# Patient Record
Sex: Female | Born: 1985 | Race: Black or African American | Hispanic: No | Smoking: Current every day smoker
Health system: Southern US, Community
[De-identification: ages and names within clinical notes are randomized; demographics above are authoritative.]

## PROBLEM LIST (undated history)

## (undated) DIAGNOSIS — M549 Dorsalgia, unspecified: Secondary | ICD-10-CM

## (undated) DIAGNOSIS — D573 Sickle-cell trait: Secondary | ICD-10-CM

## (undated) DIAGNOSIS — M25559 Pain in unspecified hip: Secondary | ICD-10-CM

## (undated) HISTORY — PX: OTHER SURGICAL HISTORY: SHX169

## (undated) HISTORY — DX: Sickle-cell trait: D57.3

---

## 2006-02-22 ENCOUNTER — Other Ambulatory Visit: Payer: Self-pay

## 2011-08-10 ENCOUNTER — Emergency Department (HOSPITAL_BASED_OUTPATIENT_CLINIC_OR_DEPARTMENT_OTHER)
Admission: EM | Admit: 2011-08-10 | Discharge: 2011-08-10 | Disposition: A | Discharge status: Home / Self Care | Payer: Medicaid Other | Attending: Emergency Medicine | Admitting: Emergency Medicine

## 2011-08-10 ENCOUNTER — Emergency Department (HOSPITAL_BASED_OUTPATIENT_CLINIC_OR_DEPARTMENT_OTHER): Payer: Medicaid Other

## 2011-08-10 ENCOUNTER — Encounter (HOSPITAL_BASED_OUTPATIENT_CLINIC_OR_DEPARTMENT_OTHER): Payer: Self-pay | Admitting: *Deleted

## 2011-08-10 DIAGNOSIS — O200 Threatened abortion: Secondary | ICD-10-CM | POA: Insufficient documentation

## 2011-08-10 DIAGNOSIS — F17200 Nicotine dependence, unspecified, uncomplicated: Secondary | ICD-10-CM | POA: Insufficient documentation

## 2011-08-10 LAB — CBC WITH DIFFERENTIAL/PLATELET
Basophils Absolute: 0 10*3/uL (ref 0.0–0.1)
Basophils Relative: 0 % (ref 0–1)
Eosinophils Absolute: 0 10*3/uL (ref 0.0–0.7)
Lymphs Abs: 4.7 10*3/uL — ABNORMAL HIGH (ref 0.7–4.0)
MCH: 28.2 pg (ref 26.0–34.0)
Neutrophils Relative %: 54 % (ref 43–77)
Platelets: 332 10*3/uL (ref 150–400)
RBC: 4.08 MIL/uL (ref 3.87–5.11)

## 2011-08-10 LAB — URINALYSIS, ROUTINE W REFLEX MICROSCOPIC
Bilirubin Urine: NEGATIVE
Bilirubin Urine: NEGATIVE
Ketones, ur: NEGATIVE mg/dL
Nitrite: NEGATIVE
Nitrite: NEGATIVE
Protein, ur: NEGATIVE mg/dL
Specific Gravity, Urine: 1.03 (ref 1.005–1.030)
Urobilinogen, UA: 1 mg/dL (ref 0.0–1.0)
pH: 6.5 (ref 5.0–8.0)

## 2011-08-10 LAB — URINE MICROSCOPIC-ADD ON

## 2011-08-10 LAB — ABO/RH: ABO/RH(D): A POS

## 2011-08-10 MED ORDER — SODIUM CHLORIDE 0.9 % IV BOLUS (SEPSIS): 1000.0000 mL | Freq: Once | INTRAVENOUS | Status: DC

## 2011-08-10 NOTE — ED Provider Notes (Signed)
History     CSN: [id number]  Arrival date & time 08/10/11  1558   First MD Initiated Contact with Patient 08/10/11 1752      Chief Complaint  Patient presents with  . Vaginal Bleeding    (Consider location/radiation/quality/duration/timing/severity/associated sxs/prior treatment) HPI  Patient with positive pregnancy test June , lmp May 21.  G2p2.  Patient followed at [facility] ob and treated for uti July 11 and treated with septra.  Now having vaginal bleeding began yesterday spotting like light period began at 5 and seen at [facility] and had [place] and told baby ok.  Patient has had three ultrasounds.  Bleeding stopped then today had one big blood clot about the size of her hand at 1530, no further bleeding.  No pain or pressure but some cramps- was having some pain.    History reviewed. No pertinent past medical history.  History reviewed. No pertinent past surgical history.  No family history on file.  History  Substance Use Topics  . Smoking status: Current Everyday Smoker -- 0.5 packs/day  . Smokeless tobacco: Not on file  . Alcohol Use: No    OB History    Grav Para Term Preterm Abortions TAB SAB Ect Mult Living   1               Review of Systems  All other systems reviewed and are negative.    Allergies  Review of patient's allergies indicates no known allergies.  Home Medications   Current Outpatient Rx  Name Route Sig Dispense Refill  . PRENATAL MULTIVITAMIN CH Oral Take 1 tablet by mouth daily.      BP 126/68  Pulse 100  Temp 98.9 F (37.2 C) (Oral)  Resp 18  Ht 5\' 6"  (1.676 m)  Wt 195 lb (88.451 kg)  BMI 31.47 kg/m2  SpO2 99%  LMP 06/13/2011  Physical Exam  Nursing note and vitals reviewed. Constitutional: She appears well-developed and well-nourished.  HENT:  Head: Normocephalic and atraumatic.  Eyes: Conjunctivae and EOM are normal. Pupils are equal, round, and reactive to light.  Neck: Normal range of motion. Neck supple.    Cardiovascular: Normal rate, regular rhythm, normal heart sounds and intact distal pulses.   Pulmonary/Chest: Effort normal and breath sounds normal.  Abdominal: Soft. Bowel sounds are normal.  Genitourinary: There is bleeding around the vagina.       Cervix with pink material protruding about 1 cm   Musculoskeletal: Normal range of motion.  Neurological: She is alert.  Skin: Skin is warm and dry.  Psychiatric: She has a normal mood and affect. Thought content normal.    ED Course  Procedures (including critical care time)  Labs Reviewed  URINALYSIS, ROUTINE W REFLEX MICROSCOPIC - Abnormal; Notable for the following:    Hgb urine dipstick LARGE (*)     All other components within normal limits  PREGNANCY, URINE - Abnormal; Notable for the following:    Preg Test, Ur POSITIVE (*)     All other components within normal limits  URINE MICROSCOPIC-ADD ON   No results found.   No diagnosis found. Repeat urine cath Results for orders placed during the hospital encounter of 08/10/11  URINALYSIS, ROUTINE W REFLEX MICROSCOPIC      Component Value Range   Color, Urine YELLOW  YELLOW   APPearance CLEAR  CLEAR   Specific Gravity, Urine 1.030  1.005 - 1.030   pH 6.5  5.0 - 8.0   Glucose, UA NEGATIVE  NEGATIVE  mg/dL   Hgb urine dipstick LARGE (*) NEGATIVE   Bilirubin Urine NEGATIVE  NEGATIVE   Ketones, ur NEGATIVE  NEGATIVE mg/dL   Protein, ur NEGATIVE  NEGATIVE mg/dL   Urobilinogen, UA 1.0  0.0 - 1.0 mg/dL   Nitrite NEGATIVE  NEGATIVE   Leukocytes, UA NEGATIVE  NEGATIVE  PREGNANCY, URINE      Component Value Range   Preg Test, Ur POSITIVE (*) NEGATIVE  URINE MICROSCOPIC-ADD ON      Component Value Range   Squamous Epithelial / LPF RARE  RARE   RBC / HPF TOO NUMEROUS TO COUNT  <3 RBC/hpf   Bacteria, UA RARE  RARE  CBC WITH DIFFERENTIAL      Component Value Range   WBC 11.6 (*) 4.0 - 10.5 K/uL   RBC 4.08  3.87 - 5.11 MIL/uL   Hemoglobin 11.5 (*) 12.0 - 15.0 g/dL   HCT 62.1  (*) 30.8 - 46.0 %   MCV 80.6  78.0 - 100.0 fL   MCH 28.2  26.0 - 34.0 pg   MCHC 35.0  30.0 - 36.0 g/dL   RDW 65.7  84.6 - 96.2 %   Platelets 332  150 - 400 K/uL   Neutrophils Relative 54  43 - 77 %   Neutro Abs 6.3  1.7 - 7.7 K/uL   Lymphocytes Relative 40  12 - 46 %   Lymphs Abs 4.7 (*) 0.7 - 4.0 K/uL   Monocytes Relative 6  3 - 12 %   Monocytes Absolute 0.7  0.1 - 1.0 K/uL   Eosinophils Relative 0  0 - 5 %   Eosinophils Absolute 0.0  0.0 - 0.7 K/uL   Basophils Relative 0  0 - 1 %   Basophils Absolute 0.0  0.0 - 0.1 K/uL  URINALYSIS, ROUTINE W REFLEX MICROSCOPIC      Component Value Range   Color, Urine YELLOW  YELLOW   APPearance CLEAR  CLEAR   Specific Gravity, Urine 1.026  1.005 - 1.030   pH 6.0  5.0 - 8.0   Glucose, UA NEGATIVE  NEGATIVE mg/dL   Hgb urine dipstick TRACE (*) NEGATIVE   Bilirubin Urine NEGATIVE  NEGATIVE   Ketones, ur NEGATIVE  NEGATIVE mg/dL   Protein, ur NEGATIVE  NEGATIVE mg/dL   Urobilinogen, UA 1.0  0.0 - 1.0 mg/dL   Nitrite NEGATIVE  NEGATIVE   Leukocytes, UA NEGATIVE  NEGATIVE  URINE MICROSCOPIC-ADD ON      Component Value Range   Squamous Epithelial / LPF RARE  RARE   RBC / HPF 0-2  <3 RBC/hpf   Bacteria, UA RARE  RARE      MDM     Patient with threatened ab.  No cultures done as patient with multiple exams this week and reports previous cultures done. Discussed with patient increasing possibility of miscarriage.  Continues with iup on ultrasound.  Patient advised of precautions.     Hilario Quarry, MD 08/10/11 2035

## 2011-08-10 NOTE — ED Notes (Signed)
Pt. Reports bleeding since yesterday.  Pt. Was seen at [facility] last pm and was told the "sac is still intact".  Pt. Is [redacted] wks pregnant and had an ultra snd at [facility] last pm.... Pt. Reports she stopped bleeding after the ED visit last night and started back with bleeding today and having large clots.   Pt. Reports mild to moderated lower abd. Pain.

## 2011-08-10 NOTE — ED Notes (Signed)
[redacted] weeks pregnant. Vaginal bleeding since yesterday. She is being treated for a UTI. Seen at [facility] 5 days ago for abd pain and U/S was normal.

## 2011-08-10 NOTE — ED Notes (Signed)
Pt. Just returned from ultra snd

## 2012-02-24 ENCOUNTER — Other Ambulatory Visit: Payer: Self-pay

## 2012-04-29 ENCOUNTER — Other Ambulatory Visit (HOSPITAL_COMMUNITY): Payer: Self-pay | Admitting: Obstetrics and Gynecology

## 2012-04-29 DIAGNOSIS — IMO0001 Reserved for inherently not codable concepts without codable children: Secondary | ICD-10-CM

## 2012-04-29 DIAGNOSIS — Z3689 Encounter for other specified antenatal screening: Secondary | ICD-10-CM

## 2012-05-21 ENCOUNTER — Encounter (HOSPITAL_COMMUNITY): Payer: Self-pay | Admitting: Obstetrics and Gynecology

## 2012-05-28 ENCOUNTER — Ambulatory Visit (HOSPITAL_COMMUNITY)
Admission: RE | Admit: 2012-05-28 | Discharge: 2012-05-28 | Disposition: A | Discharge status: Home / Self Care | Payer: Medicaid Other | Source: Ambulatory Visit | Attending: Obstetrics and Gynecology | Admitting: Obstetrics and Gynecology

## 2012-05-28 ENCOUNTER — Encounter (HOSPITAL_COMMUNITY): Payer: Self-pay

## 2012-05-28 VITALS — BP 112/59 | HR 87 | Wt 204.0 lb

## 2012-05-28 DIAGNOSIS — O358XX0 Maternal care for other (suspected) fetal abnormality and damage, not applicable or unspecified: Secondary | ICD-10-CM | POA: Insufficient documentation

## 2012-05-28 DIAGNOSIS — Z363 Encounter for antenatal screening for malformations: Secondary | ICD-10-CM | POA: Insufficient documentation

## 2012-05-28 DIAGNOSIS — IMO0001 Reserved for inherently not codable concepts without codable children: Secondary | ICD-10-CM

## 2012-05-28 DIAGNOSIS — Z1389 Encounter for screening for other disorder: Secondary | ICD-10-CM | POA: Insufficient documentation

## 2012-05-28 DIAGNOSIS — O30009 Twin pregnancy, unspecified number of placenta and unspecified number of amniotic sacs, unspecified trimester: Secondary | ICD-10-CM | POA: Insufficient documentation

## 2012-05-28 DIAGNOSIS — Z3689 Encounter for other specified antenatal screening: Secondary | ICD-10-CM

## 2012-05-28 NOTE — Progress Notes (Signed)
Eldana Isip  was seen today for an ultrasound appointment.  See full report in [facility].  Comments: Ms. 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  She has previously undergone extensive genetic counseling at [facility].  Although she is not interested in prenatal testing, she would like testing be be performed after delivery.  She would like to meet with our Genetic counselors to help arrange this following her delivery and will be evaluated at her next follow up.  An echogenic focus was seen in the left cardiac ventricle of Twin B.  This is felt to represent a calcified papillary muscle, and is not associated with structural or functional cardiac abnormalities.  Although an echogenic cardiac focus may be associated with an increased risk of Down syndrome, this risk is felt to be minimal, especially when it is seen as an isolated finding.   Impression: DC/DA twin gestation with best dates of 18 3/7 weeks A twin peak sign is clearly visualized.  A: female, maternal left, posterior placenta     Normal fetal anatomic survey; however, limited views of the fetal face and some cranial anatomy were obtained due to fetal position     Normal amniotic fluid volume     No markers associated with aneuploidy noted  B: female, maternal right, posterior placenta     Echogenic intracardiac focus.  The remainder of the fetal anatomy was within normal limits.     Normal amniotic fluid volume     No other markers associated with aneuploidy were noted  Recommendations: Recommend follow-up ultrasound examination in 4 weeks to complete anatomic survey/ interval growth. Will meet with Genetics counselor at next follow up.  Alpha Gula, MD

## 2012-06-26 ENCOUNTER — Ambulatory Visit (HOSPITAL_COMMUNITY)
Admission: RE | Admit: 2012-06-26 | Discharge: 2012-06-26 | Disposition: A | Discharge status: Home / Self Care | Payer: Medicaid Other | Source: Ambulatory Visit | Attending: Obstetrics and Gynecology | Admitting: Obstetrics and Gynecology

## 2012-06-26 VITALS — BP 111/62 | HR 93 | Wt 203.0 lb

## 2012-06-26 DIAGNOSIS — O30049 Twin pregnancy, dichorionic/diamniotic, unspecified trimester: Secondary | ICD-10-CM | POA: Insufficient documentation

## 2012-06-26 DIAGNOSIS — O99019 Anemia complicating pregnancy, unspecified trimester: Secondary | ICD-10-CM | POA: Insufficient documentation

## 2012-06-26 DIAGNOSIS — D573 Sickle-cell trait: Secondary | ICD-10-CM | POA: Insufficient documentation

## 2012-06-26 DIAGNOSIS — IMO0001 Reserved for inherently not codable concepts without codable children: Secondary | ICD-10-CM

## 2012-06-26 DIAGNOSIS — O30009 Twin pregnancy, unspecified number of placenta and unspecified number of amniotic sacs, unspecified trimester: Secondary | ICD-10-CM | POA: Insufficient documentation

## 2012-06-26 NOTE — Progress Notes (Signed)
[facility]  Indication: 27 yr old G40P2012 at [redacted]w[redacted]d with dichorionic/diamniotic twin gestation with twin B with an echogenic focus in the left ventricle for follow up ultrasound for fetal growth and complete anatomy. 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  Findings: 1. Dichorionic/diamniotic twin gestation; the dividing membrane is seen. 2. Estimated fetal weight for twin A is in the 52nd%; twin B is in the 56th%. The fetuses are concordant. 3. Both placentas are posterior without evidence of previa. 4. Normal amniotic fluid volume for both fetuses but at the top end of normal. 5. Normal transabdominal cervical length. 6. The views of the cavum and palate are limited in twin A; twin B the views of the right ventricular outflow tract and palate are limited. 7. Again seen is an echogenic focus in the left ventricle. 8. The placental cord insertions are not well visualized for either fetus.  Recommendations: 1. Twin gestation: - previously counseled - appropriate fetal growth - recommend fetal growth every 4 weeks - recommend preterm labor precautions - recommend delivery at [redacted] weeks gestation 2. Echogenic focus in twin B: - previously counseled - may have had quad screen although we do not have results 3. Previous son with Norrie disease: - patient met with genetic counselor; see separate report - inform Pediatrics at delivery 4. Limited anatomy: - will reattempt to complete on follow up ultrasound 5. Follow up in 4 weeks  Eulis Foster, MD

## 2012-06-26 NOTE — Progress Notes (Addendum)
Genetic Counseling  High-Risk Gestation Note  Appointment Date:  06/26/2012 Referred By: Ferman Hamming, MD Date of Birth:  27-Jul-1985 Partner:  Lawerance Bach   Pregnancy History: [id number] Estimated Date of Delivery: 10/26/12 Estimated Gestational Age: [redacted]w[redacted]d Attending: Eulis Foster, MD  Wendy Weaver and her partner, Mr. Lawerance Bach, were seen for genetic counseling because of a family history of Norrie disease.  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  In addition, it is suspected that Ms. Schaff' nephew, and a maternal uncle also have ND.  Considering this history, Ms. 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  By report, this couple's son was suspected to have vision concerns at age 24 months.  They were seen by multiple specialists before being referred to [facility], where they saw a pediatric ophthalmologist.  ND was suspected based on the ophthalmologic evaluation and this child was then referred to the department of medical genetics at [facility].  By report, their son had NDP molecular genetic testing and a gene alteration was detected.  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 Medical records were not available for review today.  Ms. Meneely reported that she has these records and will bring them to her next appointment.  We discussed that the accuracy of the counseling and risk assessment provided today is based on the reported information.  They were counseled that ND is one of a group of conditions called NDP-related retinopathies.  ND is the most severe phenotype, and is characterized by a combination of features.  Typically, infants with ND have greyish-yellow fibrovascular masses, called pseudogliomas, that develop secondary to retinal vascular dysgenesis and detachment.  From infancy throughout  childhood the ocular findings progress and the globes eventually appear small and shrunken in the orbits.  Congenital blindness is almost always present.  In addition, approximately 30-50% of affected males have intellectual disability, behavioral abnormalities, or psychotic-like features.   Intra and inter-familial variability in the appearance and expression of the cognitive and behavioral phenotype is common.  The majority of males with ND will develop progressive sensorineural hearing loss.  General health is normal; life span may be shortened by general risks associated with intellectual disability, blindness, and/or hearing loss.    We discussed that the causative gene, NDP, for ND is located on the X chromosome and thus, the condition follows X-linked recessive inheritance. We reviewed genes and chromosomes. We discussed that typically, humans have 23 pairs of chromosomes, 46 chromosomes total, with half inherited from each parent. The first 22 pairs are referred to as the autosomes. The last pair are referred to as the sex chromosomes. Males typically have one X and one Y chromosome, and females typically have two X chromosomes. We reviewed that in X-linked recessive inheritance, carrier females are typically unaffected and can have affected female children. A female carrier refers to a woman with one working copy and one nonworking copy of NDP. Carriers typically have no or mild symptoms of the condition. Each time a carrier female is pregnant, the pregnancy has a new 1 in 4 (25%) chance for each of the following possibilities: (1) a female who is not a carrier, (2) a carrier female like the mother, (3) an unaffected female (not a carrier), or (4) a female with ND. In X-linked recessive inheritance, when an affected female  has children, none of his sons would be affected and all of his daughters would be obligate carriers.   Given that Ms. Bradway and her son have an identified NDP alteration, we discussed  that prenatal diagnosis via amniocentesis is available.  We reviewed the risks, benefits, and limitations of amniocentesis, including the approximate 1 in 300 risk for spontaneous pregnancy loss.  We also discussed that a normal fetal ultrasound does not reduce the likelihood of ND, but can provide information regarding gender.  Ms. Shad is has had numerous ultrasound, which have documented a twin pregnancy; one fetus is female and the other appears to be female.  Thus the female fetus has a 50% chance to have inherited the NDP gene change, and would be expected to be affected with ND.  The female fetus has a 50% chance to have inherited the NDP gene change, and would be expected to be a carrier of ND.  After thoughtful consideration of these options, this couple declined amniocentesis.  They expressed interest in postnatal genetic testing.  We discussed that they would coordinate a postnatal genetics evaluation and/or genetic testing through their pediatrician.  They were again encouraged to provide medical records to verify the reported family and medical history.  Regarding other family members, Ms. Bashor reported that none of her relatives have followed-up with molecular genetic testing of NDP.  Her nephew has clinical features strongly suggestive of ND including blindness, developmental delay, and significant behavioral disturbances.  Ms. Kitch reported that her maternal uncle is also blind, but appears to have normal intellect.  She is not sure whether or not he has hearing loss.  They were counseled that genetic testing can be performed for at-risk relatives for the particular identified familial mutation.  The remainder of both family histories were reviewed and found to be contributory for Ms. Manninen having a paternal first cousin who has mental retardation of unknown etiology.  We discussed that there are many different causes of mental retardation including: environmental, multifactorial, and  genetic etiologies.  A specific diagnosis for mental retardation can be determined in approximately 50% of cases.  In the remaining 50% of cases, a diagnosis may not ever be determined.  We briefly discussed Fragile X syndrome and the availability of FMR1 gene testing.  Ms. Bennison declined.  Without more specific information, potential genetic risks cannot be determined.  The patient was advised to call if she is able to find out more information regarding a specific diagnosis.  Further genetic counseling is warranted if more information is obtained.  Ms. Hobday denied exposure to environmental toxins or chemical agents. She denied the use of alcohol, tobacco or street drugs. She denied significant viral illnesses during the course of her pregnancy.   I counseled Ms. Hill and her partner regarding the above risks and available options.  The approximate face-to-face time with the genetic counselor was 48 minutes.  Donald Prose, MS Certified Genetic Counselor  Addendum: 07/31/12 Ms. Hope returned to clinic for her follow-up ultrasound today.  She brought her son's medical records with her today.  These records confirm the reported history.  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  Ms. Drummer again declined prenatal diagnostic testing today.  As she is pregnant with twins, a boy and a girl, we again reviewed the risks of recurrence.  Ms. Acero is interested in postnatal diagnostic testing.  Contact information for Dr. Charise Killian was provided today.  The patient will contact Dr. Marylen Ponto office at delivery to coordinate a postnatal genetics appointment.   Donald Prose, MS CGC

## 2012-06-28 ENCOUNTER — Encounter (HOSPITAL_COMMUNITY): Payer: Self-pay

## 2012-07-31 ENCOUNTER — Other Ambulatory Visit (HOSPITAL_COMMUNITY): Payer: Self-pay | Admitting: Obstetrics and Gynecology

## 2012-07-31 ENCOUNTER — Ambulatory Visit (HOSPITAL_COMMUNITY)
Admission: RE | Admit: 2012-07-31 | Discharge: 2012-07-31 | Disposition: A | Discharge status: Home / Self Care | Payer: Medicaid Other | Source: Ambulatory Visit | Attending: Obstetrics and Gynecology | Admitting: Obstetrics and Gynecology

## 2012-07-31 VITALS — BP 116/71 | HR 98 | Wt 206.0 lb

## 2012-07-31 DIAGNOSIS — IMO0001 Reserved for inherently not codable concepts without codable children: Secondary | ICD-10-CM

## 2012-07-31 DIAGNOSIS — D573 Sickle-cell trait: Secondary | ICD-10-CM | POA: Insufficient documentation

## 2012-07-31 DIAGNOSIS — O30049 Twin pregnancy, dichorionic/diamniotic, unspecified trimester: Secondary | ICD-10-CM | POA: Insufficient documentation

## 2012-07-31 DIAGNOSIS — O30009 Twin pregnancy, unspecified number of placenta and unspecified number of amniotic sacs, unspecified trimester: Secondary | ICD-10-CM | POA: Insufficient documentation

## 2012-07-31 DIAGNOSIS — O99019 Anemia complicating pregnancy, unspecified trimester: Secondary | ICD-10-CM | POA: Insufficient documentation

## 2012-07-31 NOTE — Progress Notes (Signed)
[facility]  Indication: 27 yr old G19P2012 at [redacted]w[redacted]d with dichorionic/diamniotic twin gestation with twin B with an echogenic focus in the left ventricle for follow up ultrasound for fetal growth and complete anatomy. 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  Findings: 1. Dichorionic/diamniotic twin gestation; the dividing membrane is seen. 2. Estimated fetal weight for twin A is in the 45th%; twin B is in the 70th%. The fetuses are 15% discordant. 3. Both placentas are posterior without evidence of previa. 4. Normal amniotic fluid volume for twin A; slightly increased for twin B. 5. Normal transvaginal cervical length. 6. The view of the cavum is limited in twin A; twin B the views of the right ventricular outflow tract and palate are limited. 7. Normal placental cord insertions. 8. The remainder of the limited anatomy surveys for both fetuses are normal.  Recommendations: 1. Twin gestation: - previously counseled - appropriate fetal growth - recommend fetal growth every 4 weeks - recommend preterm labor precautions - recommend delivery at [redacted] weeks gestation 2. Echogenic focus in twin B: - previously counseled 3. Previous son with Norrie disease: - patient met with genetic counselor; see separate report - inform Pediatrics at delivery 4. Limited anatomy: - will reattempt to complete on follow up ultrasound 5. Follow up in 4 weeks 6. Slightly increased amniotic fluid volume: - recommend check glucola if not already performed  Eulis Foster, MD

## 2012-08-28 ENCOUNTER — Ambulatory Visit (HOSPITAL_COMMUNITY)
Admission: RE | Admit: 2012-08-28 | Discharge: 2012-08-28 | Disposition: A | Discharge status: Home / Self Care | Payer: Medicaid Other | Source: Ambulatory Visit | Attending: Obstetrics and Gynecology | Admitting: Obstetrics and Gynecology

## 2012-08-28 VITALS — BP 116/65 | HR 102 | Wt 209.0 lb

## 2012-08-28 DIAGNOSIS — IMO0001 Reserved for inherently not codable concepts without codable children: Secondary | ICD-10-CM

## 2012-08-28 DIAGNOSIS — D573 Sickle-cell trait: Secondary | ICD-10-CM | POA: Insufficient documentation

## 2012-08-28 DIAGNOSIS — O30009 Twin pregnancy, unspecified number of placenta and unspecified number of amniotic sacs, unspecified trimester: Secondary | ICD-10-CM | POA: Insufficient documentation

## 2012-08-28 DIAGNOSIS — O99019 Anemia complicating pregnancy, unspecified trimester: Secondary | ICD-10-CM | POA: Insufficient documentation

## 2012-08-28 NOTE — Progress Notes (Signed)
Wendy Weaver  was seen today for an ultrasound appointment.  See full report in [facility].  Impression: DC/DA twin gestation with best dates of 31 4/7 weeks Interval growth is concordant  A: female, cephalic, posterior placenta     Normal interval anatomy     Fetal growth is appropriate (35th %tile)     Normal amniotic fluid volume     B: female, maternal right, cephalic, posterior/ fundal placenta     Normal interval anatomy     Fetal growth is appropriate (42nd %tile)     Normal amniotic fluid volume     No other markers associated with aneuploidy were noted  Recommendations: Recommend follow-up ultrasound examination in 4 weeks for interval growth  Alpha Gula, MD

## 2012-09-25 ENCOUNTER — Encounter (HOSPITAL_COMMUNITY): Payer: Self-pay

## 2012-09-25 ENCOUNTER — Ambulatory Visit (HOSPITAL_COMMUNITY)
Admission: RE | Admit: 2012-09-25 | Discharge: 2012-09-25 | Disposition: A | Discharge status: Home / Self Care | Payer: Medicaid Other | Source: Ambulatory Visit | Attending: Obstetrics and Gynecology | Admitting: Obstetrics and Gynecology

## 2012-09-25 ENCOUNTER — Other Ambulatory Visit (HOSPITAL_COMMUNITY): Payer: Self-pay | Admitting: Maternal and Fetal Medicine

## 2012-09-25 DIAGNOSIS — D573 Sickle-cell trait: Secondary | ICD-10-CM | POA: Insufficient documentation

## 2012-09-25 DIAGNOSIS — O30009 Twin pregnancy, unspecified number of placenta and unspecified number of amniotic sacs, unspecified trimester: Secondary | ICD-10-CM | POA: Insufficient documentation

## 2012-09-25 DIAGNOSIS — IMO0001 Reserved for inherently not codable concepts without codable children: Secondary | ICD-10-CM

## 2012-09-25 DIAGNOSIS — O99019 Anemia complicating pregnancy, unspecified trimester: Secondary | ICD-10-CM | POA: Insufficient documentation

## 2012-09-25 NOTE — Progress Notes (Signed)
Wendy Weaver  was seen today for an ultrasound appointment.  See full report in [facility].  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  No stigmata associated with skeletal dysplasia noted.     Normal amniotic fluid volume     B: female, maternal right, cephalic, posterior/ fundal placenta     Normal interval anatomy     Fetal growth is appropriate (21st %tile)     Long bones measuring < 5th %tile, but normal morphology - likely constitutional.  No other findings suggestive of skeletal dysplasia.     Normal amniotic fluid volume     No other markers associated with aneuploidy were noted  Recommendations: Follow-up ultrasounds as clinically indicated.   Alpha Gula, MD

## 2012-11-06 ENCOUNTER — Telehealth (HOSPITAL_COMMUNITY): Payer: Self-pay

## 2012-11-06 NOTE — Telephone Encounter (Signed)
Wendy Weaver called today to inquire about a genetics appt. For her son.  She reported that her twins were born 1 month ago and that both are doing well.  Per our discussion prenatally, Wendy Weaver would like to follow up with pediatric genetics to have her son tested for Norrie disease.  She has called the number I provided her with for Dr. Marylen Ponto office and stated that she hasn't received a return call with an appt.  I called [name], Dr. Marylen Ponto assistant, who stated that they are in the process of scheduling and will notify her asap.  I asked [name] to please notify Wendy Weaver with an appt time, when available.  All questions answered.  Patient encouraged to call back if she has any additional questions or concerns. Donald Prose, MS Certified Genetic Counselor

## 2013-04-02 ENCOUNTER — Encounter (HOSPITAL_COMMUNITY): Payer: Self-pay | Admitting: *Deleted

## 2013-11-24 ENCOUNTER — Encounter (HOSPITAL_COMMUNITY): Payer: Self-pay | Admitting: *Deleted

## 2014-07-17 IMAGING — US US OB FOLLOW-UP
1 series · 15 of 28 positions shown · non-contrast
Comparison: none

[Series 1: us ob follow-up · 0.23mm/px · 15 of 41 slices shown]
[im 1/41]
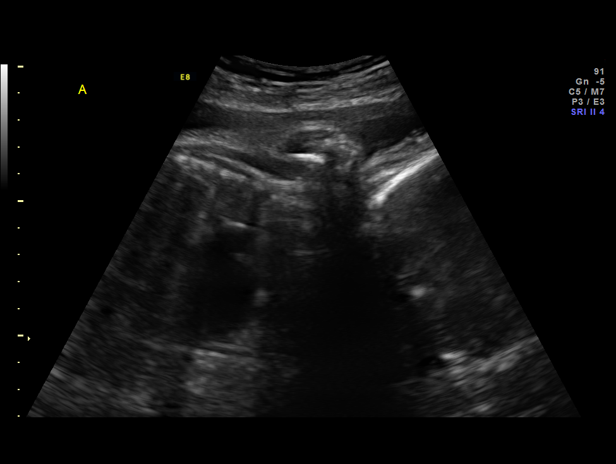
[im 3/41]
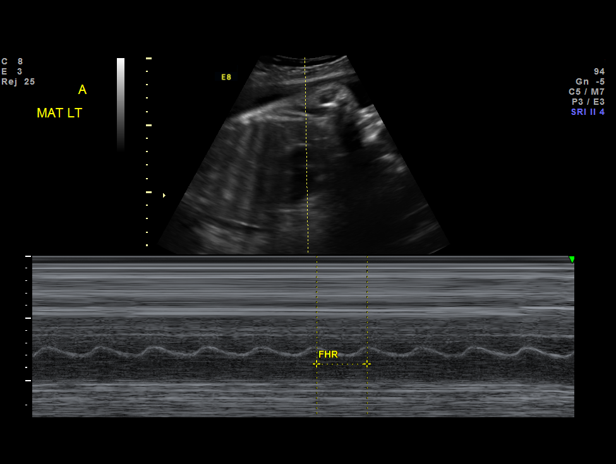
[im 6/41]
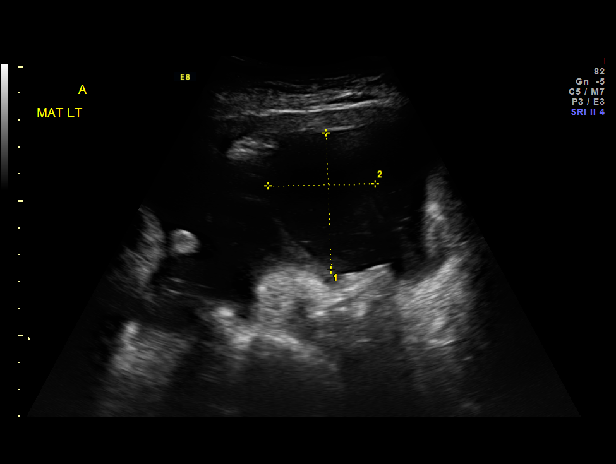
[im 9/41]
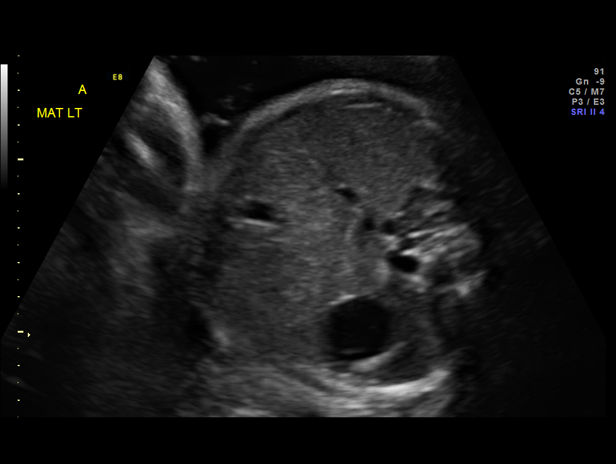
[im 12/41]
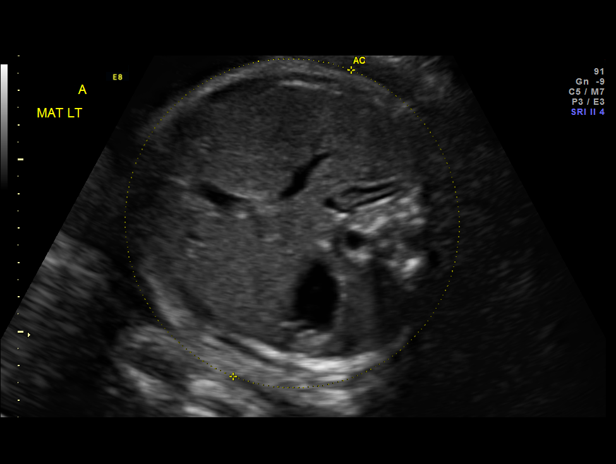
[im 15/41]
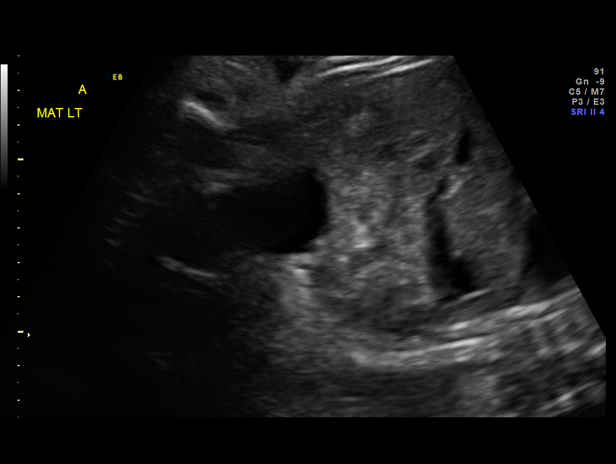
[im 18/41]
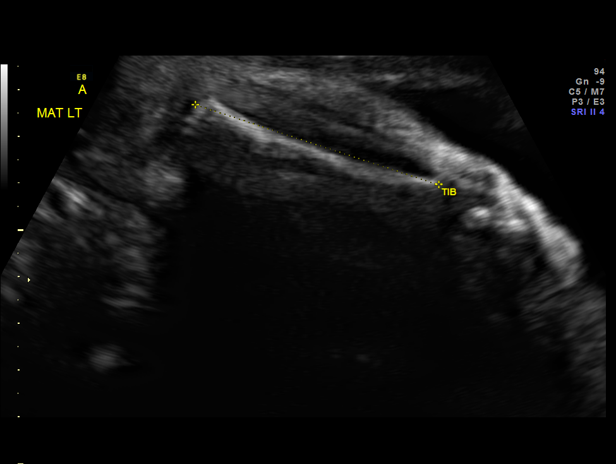
[im 21/41]
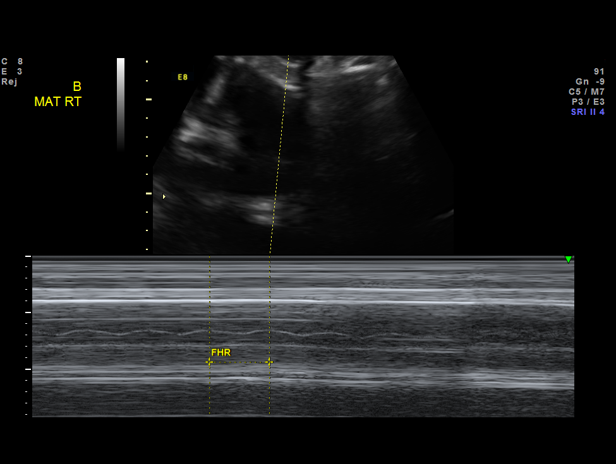
[im 23/41]
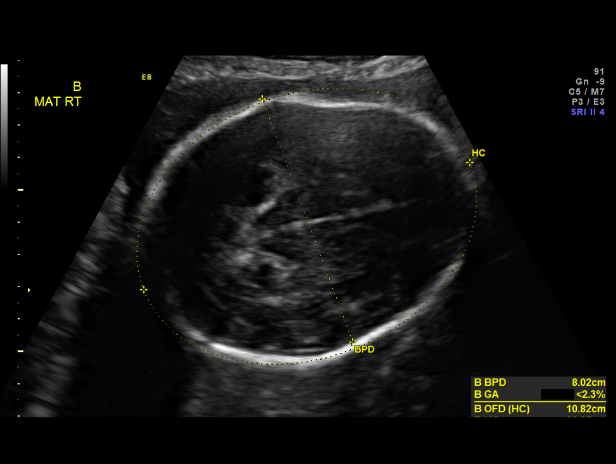
[im 26/41]
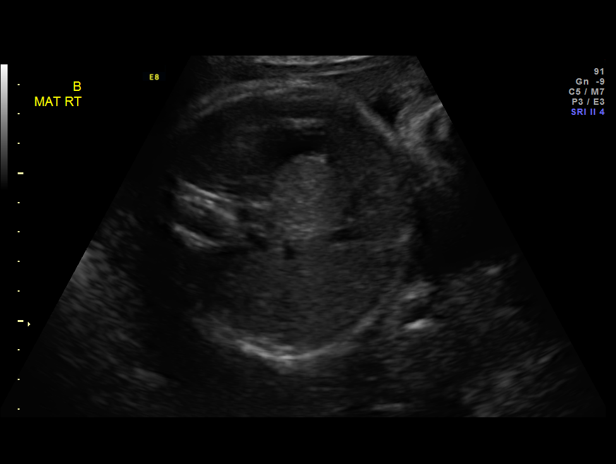
[im 29/41]
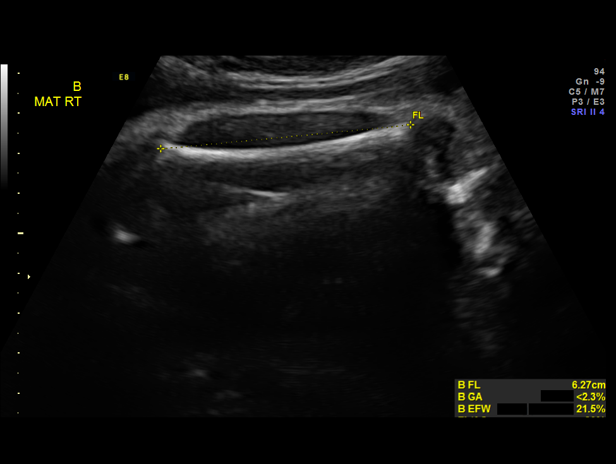
[im 32/41]
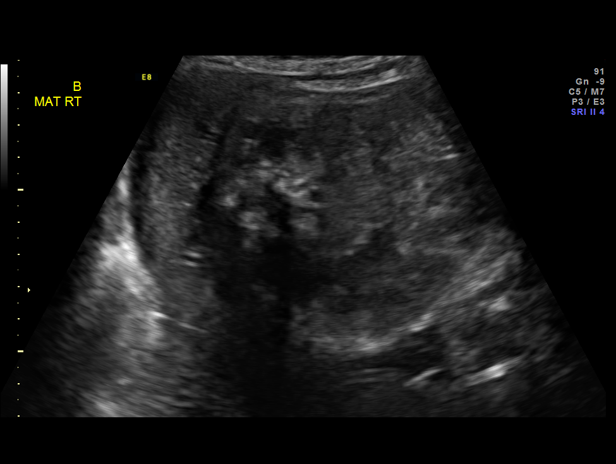
[im 35/41]
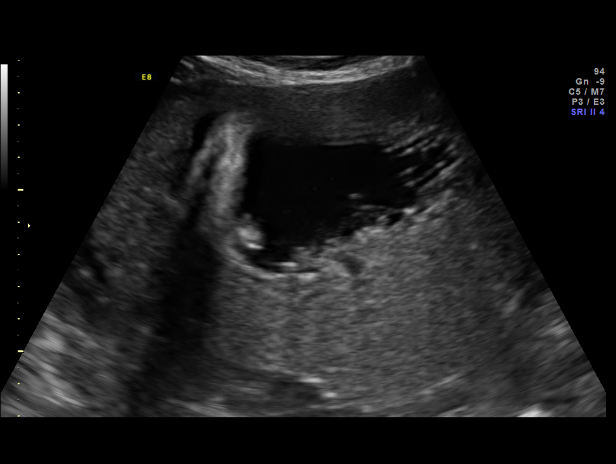
[im 38/41]
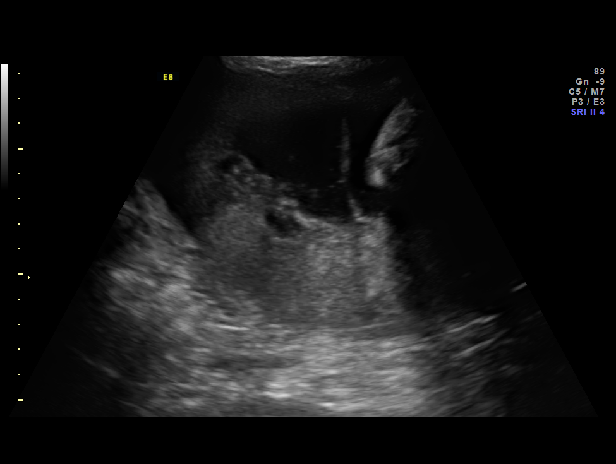
[im 41/41]
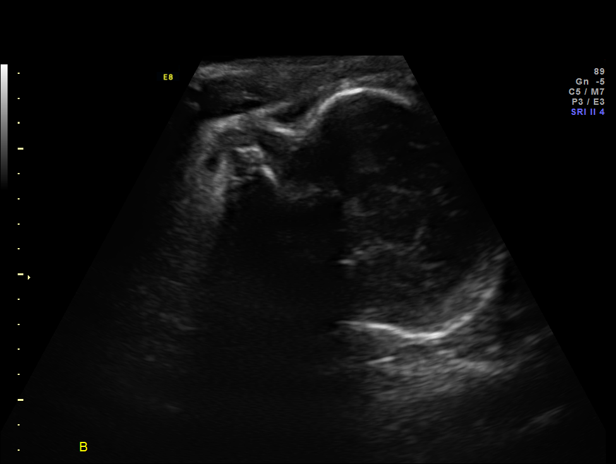

[15 of 28 positions shown; findings below may reference images not displayed]

OBSTETRICS REPORT
                      (Signed Final 09/25/2012 [DATE])

Service(s) Provided

 US OB FOLLOW UP                                       76816.1
 US OB FOLLOW UP ADDL GEST                             76816.2
Indications

 Sickle cell trait
 Twin gestation, Di-Di
Fetal Evaluation (Fetus A)

 Num Of Fetuses:    2
 Fetal Heart Rate:  169                          bpm
 Cardiac Activity:  Observed
 Fetal Lie:         Left Fetus
 Presentation:      Cephalic
 Placenta:          Posterior Fundal, above
                    cervical os
 P. Cord            Previously Visualized
 Insertion:

 Membrane Desc:     Dividing
                    Membrane seen
                    - Dichorionic.

 Amniotic Fluid
 AFI FV:      Subjectively within normal limits
                                             Larg Pckt:     5.1  cm
Biometry (Fetus A)

 BPD:       86  mm     G. Age:  34w 5d                CI:         81.9   70 - 86
 OFD:      105  mm                                    FL/HC:      20.0   20.1 -

 HC:     306.4  mm     G. Age:  34w 1d      < 3  %    HC/AC:      1.02   0.93 -

 AC:     301.1  mm     G. Age:  34w 1d       19  %    FL/BPD:     71.4   71 - 87
 FL:      61.4  mm     G. Age:  31w 6d      < 3  %    FL/AC:      20.4   20 - 24
 HUM:     54.3  mm     G. Age:  31w 4d      < 5  %
 TIB:     54.9  mm     G. Age:  32w 2d      < 5  %
 FIB:     53.9  mm     G. Age:  33w 0d       42  %

 Est. FW:    3350  gm    4 lb 14 oz      19  %     FW Discordancy         1  %
Gestational Age (Fetus A)
 LMP:           35w 4d        Date:  01/20/12                 EDD:   10/26/12
 U/S Today:     33w 5d                                        EDD:   11/08/12
 Best:          35w 4d     Det. By:  LMP  (01/20/12)          EDD:   10/26/12
Anatomy (Fetus A)

 Cranium:          Previously seen        Aortic Arch:      Previously seen
 Fetal Cavum:      Previously seen        Ductal Arch:      Previously seen
 Ventricles:       Previously seen        Diaphragm:        Previously seen
 Choroid Plexus:   Previously seen        Stomach:          Appears normal, left
                                                            sided
 Cerebellum:       Previously seen        Abdomen:          Previously seen
 Posterior Fossa:  Previously seen        Abdominal Wall:   Previously seen
 Nuchal Fold:      Previously seen        Cord Vessels:     Previously seen
 Face:             Orbits and profile     Kidneys:          Appear normal
                   previously seen
 Lips:             Previously seen        Bladder:          Appears normal
 Palate:           Previously seen        Spine:            Previously seen
 Heart:            Previously seen        Lower             Previously seen
                                          Extremities:
 RVOT:             Previously seen        Upper             Previously seen
                                          Extremities:
 LVOT:             Previously seen

 Other:  Female gender. Heels and 5th digit previously seen.

Fetal Evaluation (Fetus B)

 Num Of Fetuses:    2
 Fetal Heart Rate:  142                          bpm
 Cardiac Activity:  Observed
 Fetal Lie:         Right Fetus
 Presentation:      Cephalic
 Placenta:          Posterior Fundal, above
                    cervical os
 P. Cord            Previously Visualized
 Insertion:

 Membrane Desc:     Dividing
                    Membrane seen
                    - Dichorionic.

 Amniotic Fluid
 AFI FV:      Subjectively within normal limits
                                             Larg Pckt:     4.4  cm
Biometry (Fetus B)

 BPD:     80.6  mm     G. Age:  32w 2d                CI:         75.9   70 - 86
 OFD:    106.2  mm                                    FL/HC:      21.0   20.1 -

 HC:       298  mm     G. Age:  33w 0d      < 3  %    HC/AC:      0.97   0.93 -

 AC:     307.2  mm     G. Age:  34w 5d       33  %    FL/BPD:     77.7   71 - 87
 FL:      62.6  mm     G. Age:  32w 3d      < 3  %    FL/AC:      20.4   20 - 24
 HUM:       56  mm     G. Age:  32w 4d        9  %

 Est. FW:    8835  gm    4 lb 15 oz      21  %     FW Discordancy      0 \ 1 %
Gestational Age (Fetus B)

 LMP:           35w 4d        Date:  01/20/12                 EDD:   10/26/12
 U/S Today:     33w 1d                                        EDD:   11/12/12
 Best:          35w 4d     Det. By:  LMP  (01/20/12)          EDD:   10/26/12
Anatomy (Fetus B)

 Cranium:          Previously seen        Aortic Arch:      Previously seen
 Fetal Cavum:      Previously seen        Ductal Arch:      Previously seen
 Ventricles:       Previously seen        Diaphragm:        Previously seen
 Choroid Plexus:   Previously seen        Stomach:          Appears normal, left
                                                            sided
 Cerebellum:       Previously seen        Abdomen:          Previously seen
 Posterior Fossa:  Previously seen        Abdominal Wall:   Previously seen
 Nuchal Fold:      Previously seen        Cord Vessels:     Previously seen
 Face:             previously seen        Kidneys:          Appear normal
 Lips:             Previously seen        Bladder:          Appears normal
 Heart:            Previously seen        Spine:            Previously seen
 RVOT:             Not well visualized    Lower             Previously seen
                                          Extremities:
 LVOT:             Previously seen        Upper             Previously seen
                                          Extremities:

 Other:  Male gender. Heels and 5th digit previously seen.
Cervix Uterus Adnexa

 Cervix:       Not visualized (advanced GA >36wks)
Impression

 DC/DA twin gestation with best dates of 35 [DATE] weeks
 Interval growth is concordant

 A: female, cephalic, posterior placenta
     Normal interval anatomy
     Fetal growth is appropriate (19th %tile)
     Long bones measuring < 5th %tile- likely constitutional.
 No stigmata associated with skeletal dysplasia noted.
     Normal amniotic fluid volume

 B: male, maternal right, cephalic, posterior/ fundal placenta
     Normal interval anatomy
     Fetal growth is appropriate (21st %tile)
     Long bones measuring < 5th %tile, but normal morphology
 - likely constitutional.  No other findings suggestive of skeletal
 dysplasia.
     Normal amniotic fluid volume
     No other markers associated with aneuploidy were noted
Recommendations

 Follow-up ultrasounds as clinically indicated.

 questions or concerns.

## 2014-10-31 IMAGING — US US OB FOLLOW-UP
1 series · 15 of 28 positions shown · non-contrast
Comparison: none

[Series 1: us ob follow-up · 0.28mm/px · 15 of 58 slices shown]
[im 1/58]
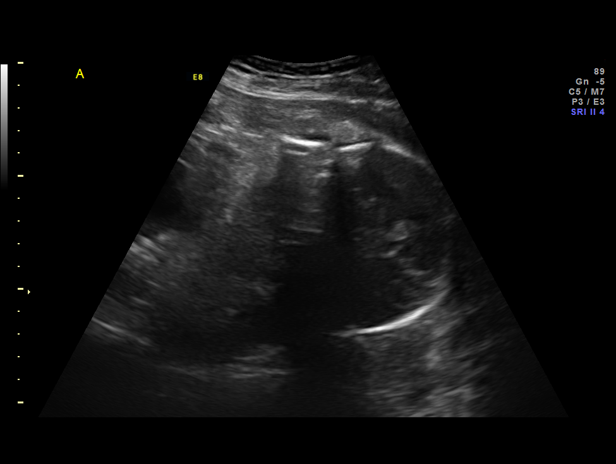
[im 5/58]
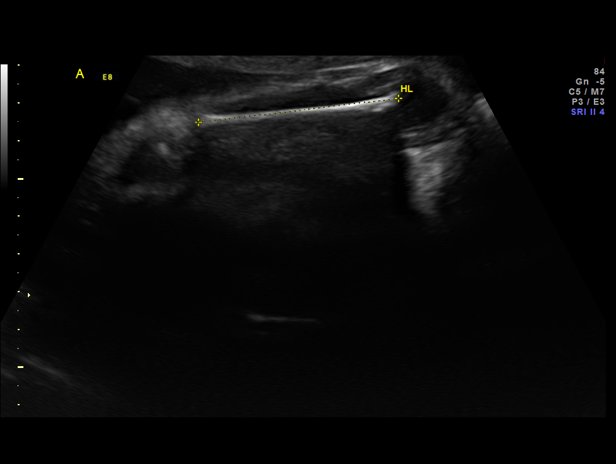
[im 9/58]
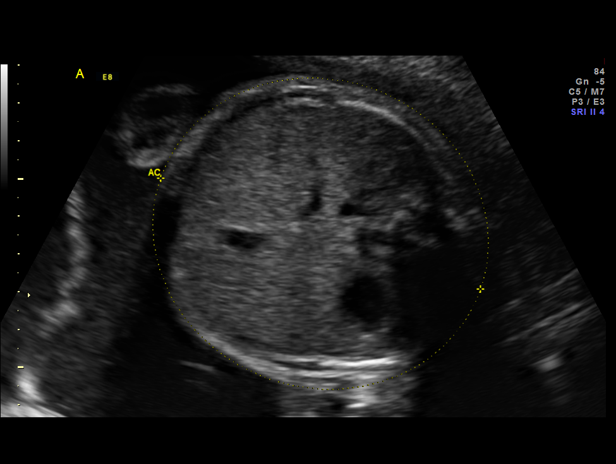
[im 13/58]
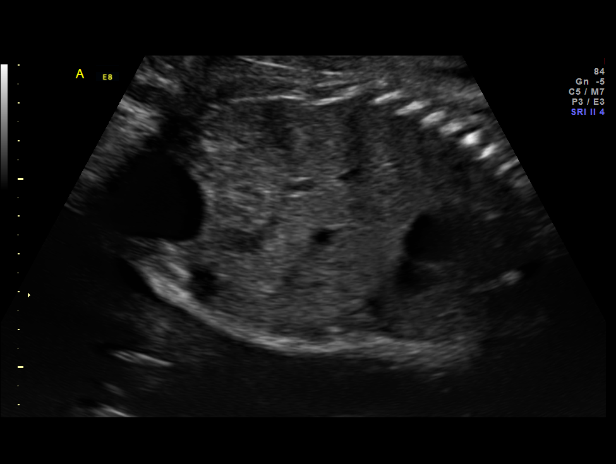
[im 17/58]
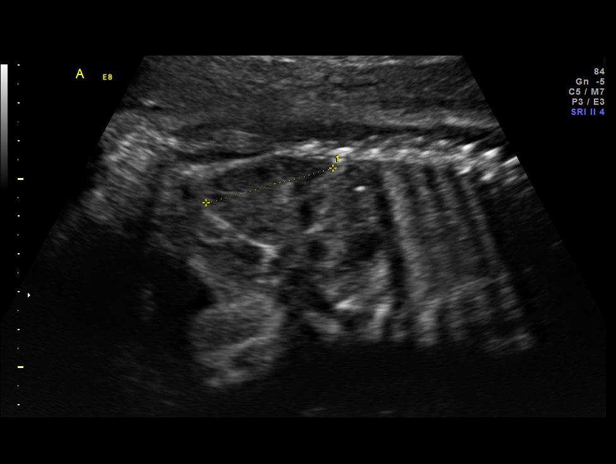
[im 22/58]
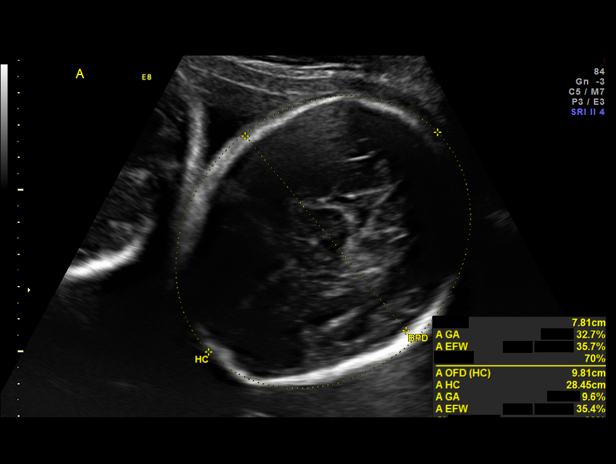
[im 26/58]
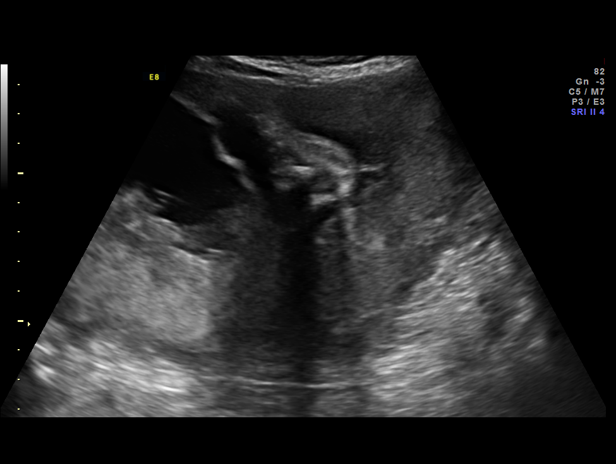
[im 30/58]
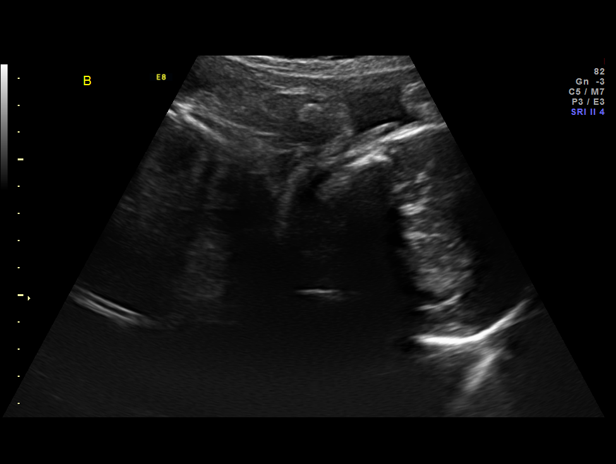
[im 32/58]
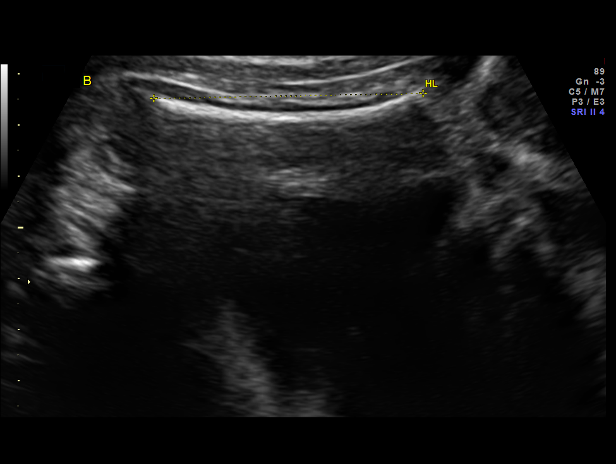
[im 36/58]
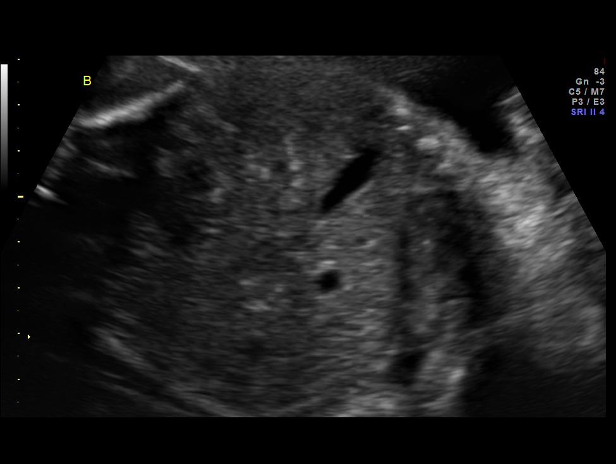
[im 41/58]
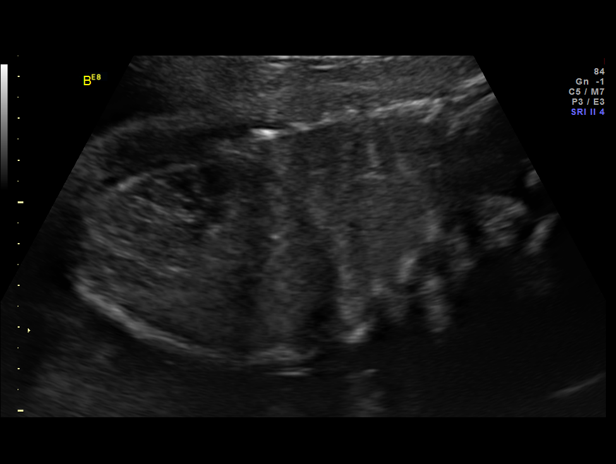
[im 45/58]
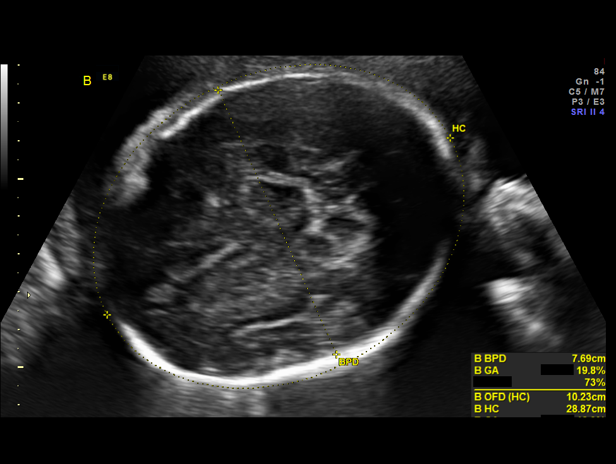
[im 49/58]
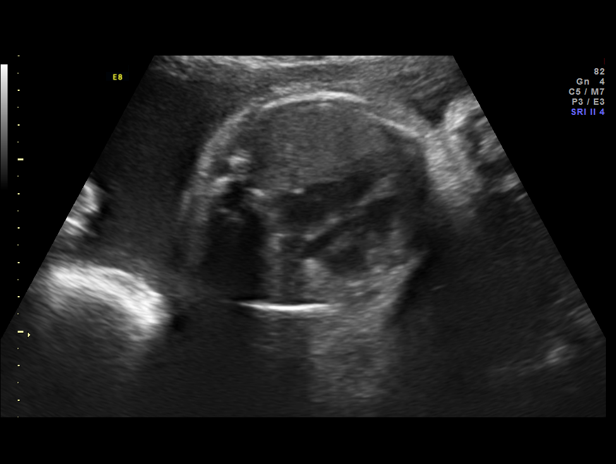
[im 53/58]
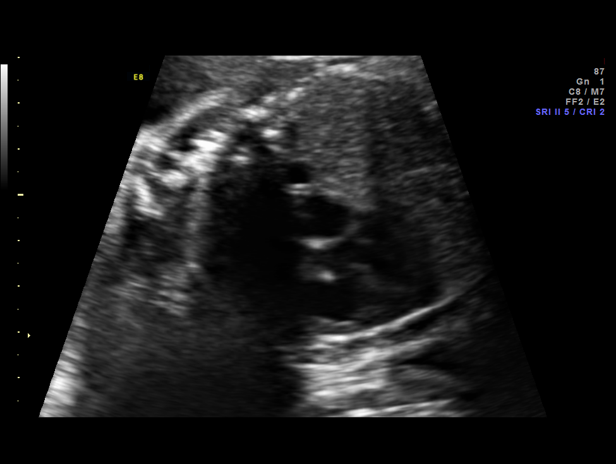
[im 58/58]
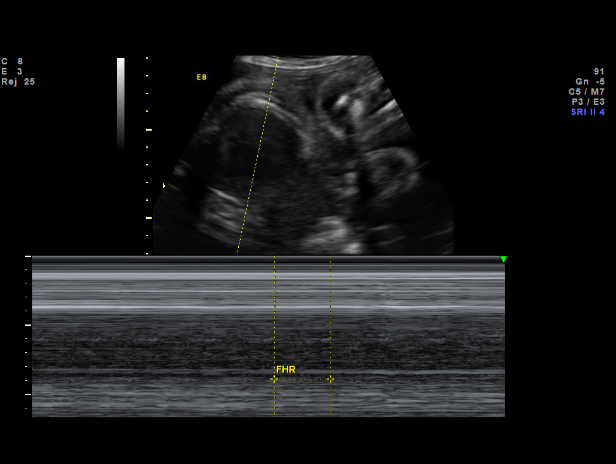

[15 of 28 positions shown; findings below may reference images not displayed]

OBSTETRICS REPORT
                      (Signed Final 08/28/2012 [DATE])

Service(s) Provided

 US OB FOLLOW UP                                       76816.1
 US OB FOLLOW UP ADDL GEST                             76816.2
Indications

 Sickle cell trait
 Twin gestation, Di-Di
Fetal Evaluation (Fetus A)

 Num Of Fetuses:    2
 Fetal Heart Rate:  163                          bpm
 Cardiac Activity:  Observed
 Fetal Lie:         Left Fetus
 Presentation:      Cephalic
 Placenta:          Posterior Fundal, above
                    cervical os
 P. Cord            Previously Visualized
 Insertion:

 Membrane Desc:     Dividing
                    Membrane seen
                    - Dichorionic.

 Amniotic Fluid
 AFI FV:      Subjectively within normal limits
                                             Larg Pckt:     5.9  cm
Biometry (Fetus A)

 BPD:     79.4  mm     G. Age:  31w 6d                CI:         79.4   70 - 86
 OFD:      100  mm                                    FL/HC:      19.1   19.1 -

 HC:     286.7  mm     G. Age:  31w 3d       15  %    HC/AC:      1.06   0.96 -

 AC:       271  mm     G. Age:  31w 1d       37  %    FL/BPD:     69.0   71 - 87
 FL:      54.8  mm     G. Age:  28w 6d      < 3  %    FL/AC:      20.2   20 - 24
 HUM:     52.6  mm     G. Age:  30w 5d       33  %

 Est. FW:    0778  gm      3 lb 8 oz     35  %     FW Discordancy         3  %
Gestational Age (Fetus A)

 LMP:           31w 4d        Date:  01/20/12                 EDD:   10/26/12
 U/S Today:     30w 6d                                        EDD:   10/31/12
 Best:          31w 4d     Det. By:  LMP  (01/20/12)          EDD:   10/26/12
Anatomy (Fetus A)

 Cranium:          Previously seen        Aortic Arch:      Previously seen
 Fetal Cavum:      Appears normal         Ductal Arch:      Previously seen
 Ventricles:       Previously seen        Diaphragm:        Appears normal
 Choroid Plexus:   Previously seen        Stomach:          Appears normal, left
                                                            sided
 Cerebellum:       Previously seen        Abdomen:          Previously seen
 Posterior Fossa:  Previously seen        Abdominal Wall:   Previously seen
 Nuchal Fold:      Previously seen        Cord Vessels:     Previously seen
 Face:             Orbits and profile     Kidneys:          Appear normal
                   previously seen
 Lips:             Previously seen        Bladder:          Appears normal
 Palate:           Previously seen        Spine:            Previously seen
 Heart:            Appears normal         Lower             Previously seen
                   (4CH, axis, and        Extremities:
                   situs)
 RVOT:             Previously seen        Upper             Previously seen
                                          Extremities:
 LVOT:             Previously seen

 Other:  Female gender. Heels and 5th digit previously seen.

Fetal Evaluation (Fetus B)

 Num Of Fetuses:    2
 Fetal Heart Rate:  152                          bpm
 Cardiac Activity:  Observed
 Fetal Lie:         Right Fetus
 Presentation:      Cephalic
 Placenta:          Posterior Fundal, above
                    cervical os
 P. Cord            Previously Visualized
 Insertion:

 Membrane Desc:     Dividing
                    Membrane seen
                    - Dichorionic.

 Amniotic Fluid
 AFI FV:      Subjectively within normal limits
                                             Larg Pckt:     5.9  cm
Biometry (Fetus B)

 BPD:     77.5  mm     G. Age:  31w 1d                CI:         76.4   70 - 86
 OFD:    101.4  mm                                    FL/HC:      20.0   19.1 -

 HC:     285.3  mm     G. Age:  31w 2d       12  %    HC/AC:      1.05   0.96 -

 AC:     272.6  mm     G. Age:  31w 3d       41  %    FL/BPD:     73.5   71 - 87
 FL:        57  mm     G. Age:  29w 6d        6  %    FL/AC:      20.9   20 - 24
 HUM:     52.8  mm     G. Age:  30w 5d       35  %

 Est. FW:    4411  gm    3 lb 10 oz      42  %     FW Discordancy      0 \ 3 %
Gestational Age (Fetus B)
 LMP:           31w 4d        Date:  01/20/12                 EDD:   10/26/12
 U/S Today:     31w 0d                                        EDD:   10/30/12
 Best:          31w 4d     Det. By:  LMP  (01/20/12)          EDD:   10/26/12
Anatomy (Fetus B)

 Cranium:          Previously seen        Aortic Arch:      Previously seen
 Fetal Cavum:      Previously seen        Ductal Arch:      Previously seen
 Ventricles:       Previously seen        Diaphragm:        Appears normal
 Choroid Plexus:   Previously seen        Stomach:          Appears normal, left
                                                            sided
 Cerebellum:       Previously seen        Abdomen:          Previously seen
 Posterior Fossa:  Previously seen        Abdominal Wall:   Previously seen
 Nuchal Fold:      Previously seen        Cord Vessels:     Previously seen
 Face:             previously seen        Kidneys:          Appear normal
 Lips:             Previously seen        Bladder:          Appears normal
 Palate:           Not well visualized    Spine:            Previously seen
 Heart:            Appears normal         Lower             Previously seen
                   (4CH, axis, and        Extremities:
                   situs)
 RVOT:             Not well visualized    Upper             Previously seen
                                          Extremities:
 LVOT:             Previously seen

 Other:  Male gender. Heels and 5th digit previously seen.
Cervix Uterus Adnexa

 Cervix:       Not visualized (advanced GA >12wks)
Impression

 DC/DA twin gestation with best dates of 31 [DATE] weeks
 Interval growth is concordant

 A: female, cephalic, posterior placenta
     Normal interval anatomy
     Fetal growth is appropriate (35th %tile)
     Normal amniotic fluid volume

 B: male, maternal right, cephalic, posterior/ fundal placenta
     Normal interval anatomy
     Fetal growth is appropriate (42nd %tile)
     Normal amniotic fluid volume
     No other markers associated with aneuploidy were noted
Recommendations

 Recommend follow-up ultrasound examination in 4 weeks for
 interval growth

 questions or concerns.

## 2015-08-30 ENCOUNTER — Emergency Department (HOSPITAL_BASED_OUTPATIENT_CLINIC_OR_DEPARTMENT_OTHER)
Admission: EM | Admit: 2015-08-30 | Discharge: 2015-08-31 | Disposition: A | Discharge status: Home / Self Care | Payer: Medicaid Other | Attending: Emergency Medicine | Admitting: Emergency Medicine

## 2015-08-30 ENCOUNTER — Encounter (HOSPITAL_BASED_OUTPATIENT_CLINIC_OR_DEPARTMENT_OTHER): Payer: Self-pay

## 2015-08-30 ENCOUNTER — Emergency Department (HOSPITAL_BASED_OUTPATIENT_CLINIC_OR_DEPARTMENT_OTHER): Payer: Medicaid Other

## 2015-08-30 DIAGNOSIS — M546 Pain in thoracic spine: Secondary | ICD-10-CM | POA: Diagnosis present

## 2015-08-30 DIAGNOSIS — Z79899 Other long term (current) drug therapy: Secondary | ICD-10-CM | POA: Insufficient documentation

## 2015-08-30 DIAGNOSIS — J189 Pneumonia, unspecified organism: Secondary | ICD-10-CM | POA: Insufficient documentation

## 2015-08-30 DIAGNOSIS — F17200 Nicotine dependence, unspecified, uncomplicated: Secondary | ICD-10-CM | POA: Diagnosis not present

## 2015-08-30 HISTORY — DX: Pain in unspecified hip: M25.559

## 2015-08-30 HISTORY — DX: Dorsalgia, unspecified: M54.9

## 2015-08-30 LAB — URINALYSIS, ROUTINE W REFLEX MICROSCOPIC
Bilirubin Urine: NEGATIVE
GLUCOSE, UA: NEGATIVE mg/dL
KETONES UR: 15 mg/dL — AB
LEUKOCYTES UA: NEGATIVE
Nitrite: NEGATIVE
PROTEIN: NEGATIVE mg/dL
Specific Gravity, Urine: 1.024 (ref 1.005–1.030)
pH: 6 (ref 5.0–8.0)

## 2015-08-30 LAB — PREGNANCY, URINE: PREG TEST UR: NEGATIVE

## 2015-08-30 LAB — URINE MICROSCOPIC-ADD ON: WBC, UA: NONE SEEN WBC/hpf (ref 0–5)

## 2015-08-30 NOTE — ED Notes (Signed)
C/o rt mid back pain x 2 days,  Denies inj,  Denies ua sx

## 2015-08-30 NOTE — ED Triage Notes (Signed)
C/o mid/upper right side back pain started yesterday-denies injury

## 2015-08-30 NOTE — ED Provider Notes (Signed)
[facility] Provider Note   CSN: [id number] Arrival date & time: 08/30/15  2102  First Provider Contact:  None     By signing my name below, I, Emmanuella Mensah, attest that this documentation has been prepared under the direction and in the presence of Shawn Joy, PA-C. Electronically Signed: Judithann Sauger, ED Scribe. 08/30/15. 11:37 PM.   History   Chief Complaint Chief Complaint  Patient presents with  . Back Pain   HPI Comments: Wendy Weaver is a 30 y.o. female with a hx of chronic back pain and hip pain who presents to the Emergency Department complaining of gradually worsening moderate right upper back pain onset yesterday. She reports that the pain is worse with deep breathing. She reports that she had a URI 2 weeks ago and although she still wants to cough, she does not cough due to the pain in her back. No alleviating factors noted. Pt has not tried any medications PTA. She denies any recent injuries or trauma. No hx of PE/DVT. No recent long car/plane trips, hospitalizations, or immobilizations. She is currently on Depo birth control, with no menstrual cycles. She denies any fever, chills, leg swelling, shortness of breath, chest pain, abdominal pain, n/v, any urinary symptoms, or any other complaints.   The history is provided by the patient. No language interpreter was used.    Past Medical History:  Diagnosis Date  . Back pain   . Hip pain     There are no active problems to display for this patient.   Past Surgical History:  Procedure Laterality Date  . arm surgery      OB History    Gravida Para Term Preterm AB Living   4 2 2  0 1 2   SAB TAB Ectopic Multiple Live Births   1 0 0 0         Home Medications    Prior to Admission medications   Medication Sig Start Date End Date Taking? Authorizing Provider  HYDROcodone-acetaminophen (NORCO/VICODIN) 5-325 MG tablet Take 1 tablet by mouth every 6 (six) hours as needed for moderate pain.    Yes Historical Provider, MD  MELOXICAM PO Take by mouth.   Yes Historical Provider, MD  TiZANidine HCl (ZANAFLEX PO) Take by mouth.   Yes Historical Provider, MD  azithromycin (ZITHROMAX) 250 MG tablet Take 1 tablet (250 mg total) by mouth daily. Take 1 tablet daily until finished. 09/01/15   Shawn C Joy, PA-C  benzonatate (TESSALON) 100 MG capsule Take 1 capsule (100 mg total) by mouth every 8 (eight) hours. 08/31/15   Shawn C Joy, PA-C  naproxen (NAPROSYN) 500 MG tablet Take 1 tablet (500 mg total) by mouth 2 (two) times daily. 08/31/15   Lorayne Bender, PA-C    Family History No family history on file.  Social History Social History  Substance Use Topics  . Smoking status: Current Every Day Smoker    Packs/day: 0.50  . Smokeless tobacco: Never Used  . Alcohol use No     Allergies   Review of patient's allergies indicates no known allergies.    Review of Systems Review of Systems  Constitutional: Negative for chills and fever.  Cardiovascular: Negative for leg swelling.  Gastrointestinal: Negative for abdominal pain, nausea and vomiting.  Genitourinary: Negative for dysuria, frequency and hematuria.  Musculoskeletal: Positive for back pain.  All other systems reviewed and are negative.    Physical Exam Updated Vital Signs Vitals:   08/30/15 2111 08/31/15 0047  BP: 128/78 109/68  Pulse: 79 65  Resp: 20 18  Temp: 98.6 F (37 C)      Physical Exam  Constitutional: She appears well-developed and well-nourished. No distress.  HENT:  Head: Normocephalic and atraumatic.  Eyes: Conjunctivae are normal.  Neck: Neck supple.  Cardiovascular: Normal rate, regular rhythm, normal heart sounds and intact distal pulses.   Pulmonary/Chest: Effort normal and breath sounds normal. No respiratory distress.  Abdominal: Soft. There is no tenderness. There is no guarding.  Negative CVA tenderness   Musculoskeletal:  Patient's back pain is exacerbated by movement and deep breathing. Full  ROM in all extremities and spine. No midline spinal tenderness.   Lymphadenopathy:    She has no cervical adenopathy.  Neurological: She is alert. She has normal reflexes.  No sensory deficits. Strength 5/5 in all extremities. No gait disturbance. Coordination intact.   Skin: Skin is warm and dry. She is not diaphoretic.  Psychiatric: She has a normal mood and affect. Her behavior is normal.  Nursing note and vitals reviewed.    ED Treatments / Results  DIAGNOSTIC STUDIES: Oxygen Saturation is 99% on RA, normal by my interpretation.    COORDINATION OF CARE: 11:29 PM- Pt advised of plan for treatment and pt agrees. Pt will receive x-ray for further evaluation.   Labs (all labs ordered are listed, but only abnormal results are displayed) Labs Reviewed  URINALYSIS, ROUTINE W REFLEX MICROSCOPIC (NOT AT [facility]) - Abnormal; Notable for the following:       Result Value   Hgb urine dipstick MODERATE (*)    Ketones, ur 15 (*)    All other components within normal limits  URINE MICROSCOPIC-ADD ON - Abnormal; Notable for the following:    Squamous Epithelial / LPF 0-5 (*)    Bacteria, UA FEW (*)    All other components within normal limits  PREGNANCY, URINE    EKG  EKG Interpretation None       Radiology Dg Chest 2 View  Result Date: 08/31/2015 CLINICAL DATA:  Acute onset of shortness of breath and right-sided chest pain. Initial encounter. EXAM: CHEST  2 VIEW COMPARISON:  None. FINDINGS: The lungs are well-aerated. Pulmonary vascularity is at the upper limits of normal. Mild right basilar opacity may reflect pneumonia. There is no evidence of pleural effusion or pneumothorax. The heart is normal in size; the mediastinal contour is within normal limits. No acute osseous abnormalities are seen. IMPRESSION: Mild right basilar opacity may reflect pneumonia. Electronically Signed   By: Garald Balding M.D.   On: 08/31/2015 00:50    Procedures Procedures (including critical care  time)  Medications Ordered in ED Medications  methocarbamol (ROBAXIN) tablet 1,000 mg (1,000 mg Oral Given 08/31/15 0025)  ketorolac (TORADOL) injection 60 mg (60 mg Intramuscular Given 08/31/15 0025)  azithromycin (ZITHROMAX) tablet 500 mg (500 mg Oral Given 08/31/15 0109)     Initial Impression / Assessment and Plan / ED Course  Arlean Hopping, PA-C has reviewed the triage vital signs and the nursing notes.  Pertinent labs & imaging results that were available during my care of the patient were reviewed by me and considered in my medical decision making (see chart for details).  Clinical Course    Wendy Weaver presents with acute on chronic right mid to upper back pain recurring yesterday.  Patient is nontoxic appearing, afebrile, not tachycardic, not tachypneic, not hypotensive, maintains SPO2 of 99% on room air, and is in no apparent distress. Patient has no signs  of sepsis or other serious or life-threatening condition. Evidence of possible pneumonia on chest x-ray.  It should be noted that when I walked into the patient's room for initial contact, she was noted to be freely moving around on the bed and showing no signs of any pain or distress. As soon as I introduced myself as provider, patient began to stay very still, moan, and made it seem as though she was in pain and distress. Upon reassessment, patient was noted to be resting comfortably on the bed playing with her phone. The patient was given instructions for home care as well as return precautions. Patient voices understanding of these instructions, accepts the plan, and is comfortable with discharge.  Vitals:   08/30/15 2111 08/31/15 0047  BP: 128/78 109/68  Pulse: 79 65  Resp: 20 18  Temp: 98.6 F (37 C)   TempSrc: Oral   SpO2: 99% 100%  Weight: 86.2 kg   Height: 5\' 7"  (1.702 m)      Final Clinical Impressions(s) / ED Diagnoses   Final diagnoses:  CAP (community acquired pneumonia)   I personally performed the  services described in this documentation, which was scribed in my presence. The recorded information has been reviewed and is accurate.     Prescriptions Discharge Medication List as of 08/31/2015  1:05 AM    START taking these medications   Details  azithromycin (ZITHROMAX) 250 MG tablet Take 1 tablet (250 mg total) by mouth daily. Take 1 tablet daily until finished., Starting Wed 09/01/2015, Print    benzonatate (TESSALON) 100 MG capsule Take 1 capsule (100 mg total) by mouth every 8 (eight) hours., Starting Tue 08/31/2015, Print    naproxen (NAPROSYN) 500 MG tablet Take 1 tablet (500 mg total) by mouth 2 (two) times daily., Starting Tue 08/31/2015, Print         Lorayne Bender, PA-C 08/31/15 0123    April Palumbo, MD 08/31/15 [phone]

## 2015-08-31 MED ORDER — NAPROXEN 500 MG PO TABS: 500.0000 mg | ORAL_TABLET | Freq: Two times a day (BID) | ORAL | 0 refills | Status: DC

## 2015-08-31 MED ORDER — AZITHROMYCIN 250 MG PO TABS: 250.0000 mg | ORAL_TABLET | Freq: Every day | ORAL | 0 refills | Status: DC

## 2015-08-31 MED ORDER — AZITHROMYCIN 250 MG PO TABS
500.0000 mg | ORAL_TABLET | Freq: Once | ORAL | Status: AC
  Administered 2015-08-31: 500 mg via ORAL
  Filled 2015-08-31: qty 2

## 2015-08-31 MED ORDER — KETOROLAC TROMETHAMINE 60 MG/2ML IM SOLN
60.0000 mg | Freq: Once | INTRAMUSCULAR | Status: AC
  Administered 2015-08-31: 60 mg via INTRAMUSCULAR
  Filled 2015-08-31: qty 2

## 2015-08-31 MED ORDER — BENZONATATE 100 MG PO CAPS: 100.0000 mg | ORAL_CAPSULE | Freq: Three times a day (TID) | ORAL | 0 refills | Status: DC

## 2015-08-31 MED ORDER — METHOCARBAMOL 500 MG PO TABS
1000.0000 mg | ORAL_TABLET | Freq: Once | ORAL | Status: AC
  Administered 2015-08-31: 1000 mg via ORAL
  Filled 2015-08-31: qty 2

## 2015-08-31 NOTE — Discharge Instructions (Signed)
You have been seen today for back pain. Your imaging showed signs of pneumonia. Please take all of your antibiotics until finished!   You may develop abdominal discomfort or diarrhea from the antibiotic.  You may help offset this with probiotics which you can buy or get in yogurt. Do not eat or take the probiotics until 2 hours after your antibiotic. Follow up with PCP as needed. Return to ED should symptoms worsen.

## 2016-11-10 DIAGNOSIS — N870 Mild cervical dysplasia: Secondary | ICD-10-CM | POA: Diagnosis not present

## 2016-11-10 DIAGNOSIS — Z01812 Encounter for preprocedural laboratory examination: Secondary | ICD-10-CM | POA: Diagnosis not present

## 2016-11-10 DIAGNOSIS — R87612 Low grade squamous intraepithelial lesion on cytologic smear of cervix (LGSIL): Secondary | ICD-10-CM | POA: Diagnosis not present

## 2016-11-13 DIAGNOSIS — N870 Mild cervical dysplasia: Secondary | ICD-10-CM | POA: Diagnosis not present

## 2016-11-17 DIAGNOSIS — N870 Mild cervical dysplasia: Secondary | ICD-10-CM | POA: Diagnosis not present

## 2017-03-14 DIAGNOSIS — Z124 Encounter for screening for malignant neoplasm of cervix: Secondary | ICD-10-CM | POA: Diagnosis not present

## 2017-04-07 DIAGNOSIS — H5213 Myopia, bilateral: Secondary | ICD-10-CM | POA: Diagnosis not present

## 2017-04-29 DIAGNOSIS — J069 Acute upper respiratory infection, unspecified: Secondary | ICD-10-CM | POA: Diagnosis not present

## 2017-04-29 DIAGNOSIS — K529 Noninfective gastroenteritis and colitis, unspecified: Secondary | ICD-10-CM | POA: Diagnosis not present

## 2017-05-01 DIAGNOSIS — J069 Acute upper respiratory infection, unspecified: Secondary | ICD-10-CM | POA: Diagnosis not present

## 2017-05-01 DIAGNOSIS — G43019 Migraine without aura, intractable, without status migrainosus: Secondary | ICD-10-CM | POA: Diagnosis not present

## 2017-05-01 DIAGNOSIS — M222X1 Patellofemoral disorders, right knee: Secondary | ICD-10-CM | POA: Diagnosis not present

## 2017-05-01 DIAGNOSIS — M222X2 Patellofemoral disorders, left knee: Secondary | ICD-10-CM | POA: Diagnosis not present

## 2017-06-14 DIAGNOSIS — R102 Pelvic and perineal pain: Secondary | ICD-10-CM | POA: Diagnosis not present

## 2017-06-14 DIAGNOSIS — N926 Irregular menstruation, unspecified: Secondary | ICD-10-CM | POA: Diagnosis not present

## 2017-06-21 IMAGING — CR DG CHEST 2V
2 series · 2 of 2 positions shown · non-contrast
Comparison: None.

CLINICAL DATA: Acute onset of shortness of breath and right-sided
chest pain. Initial encounter.

EXAM:
CHEST  2 VIEW

[w chest pa]
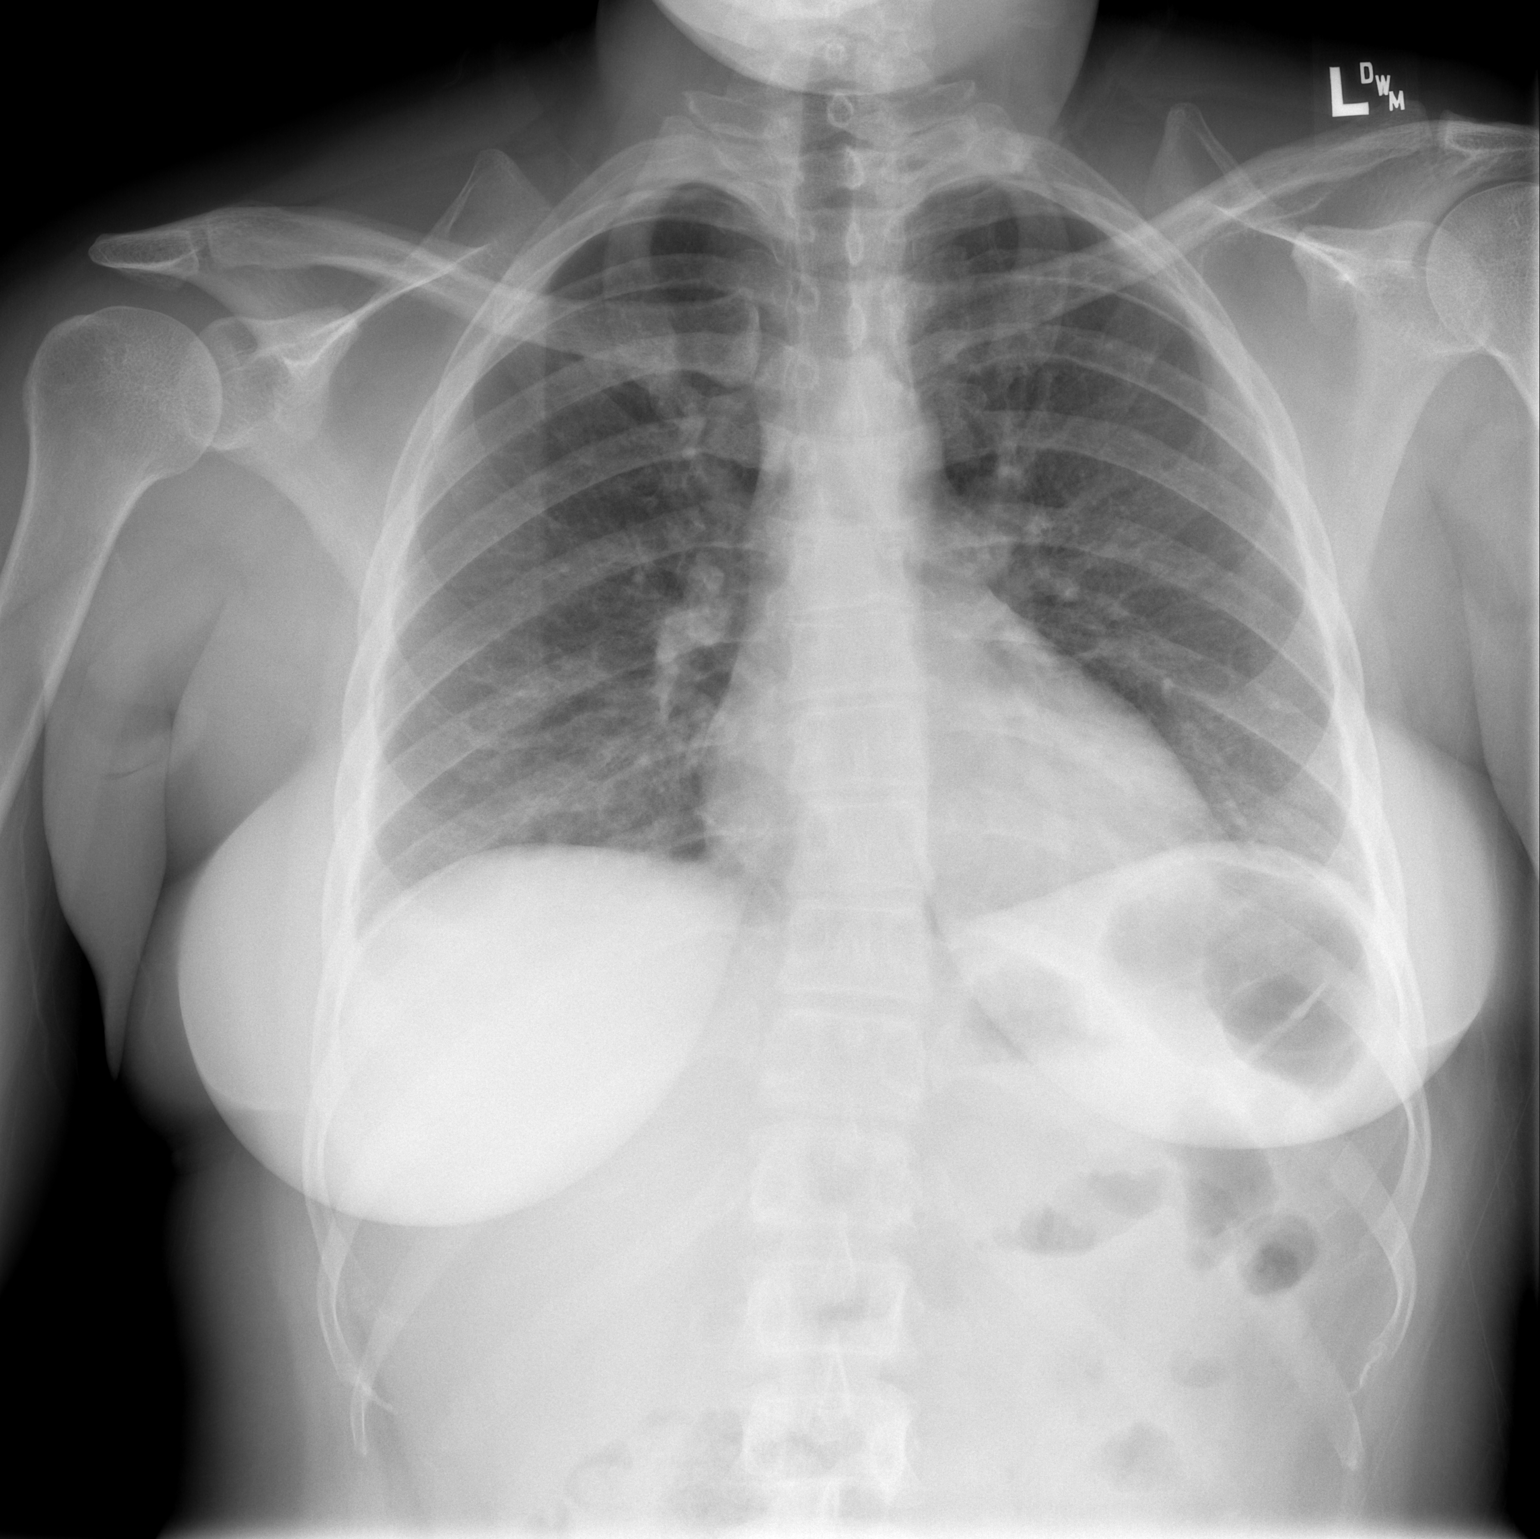

[w chest lat]
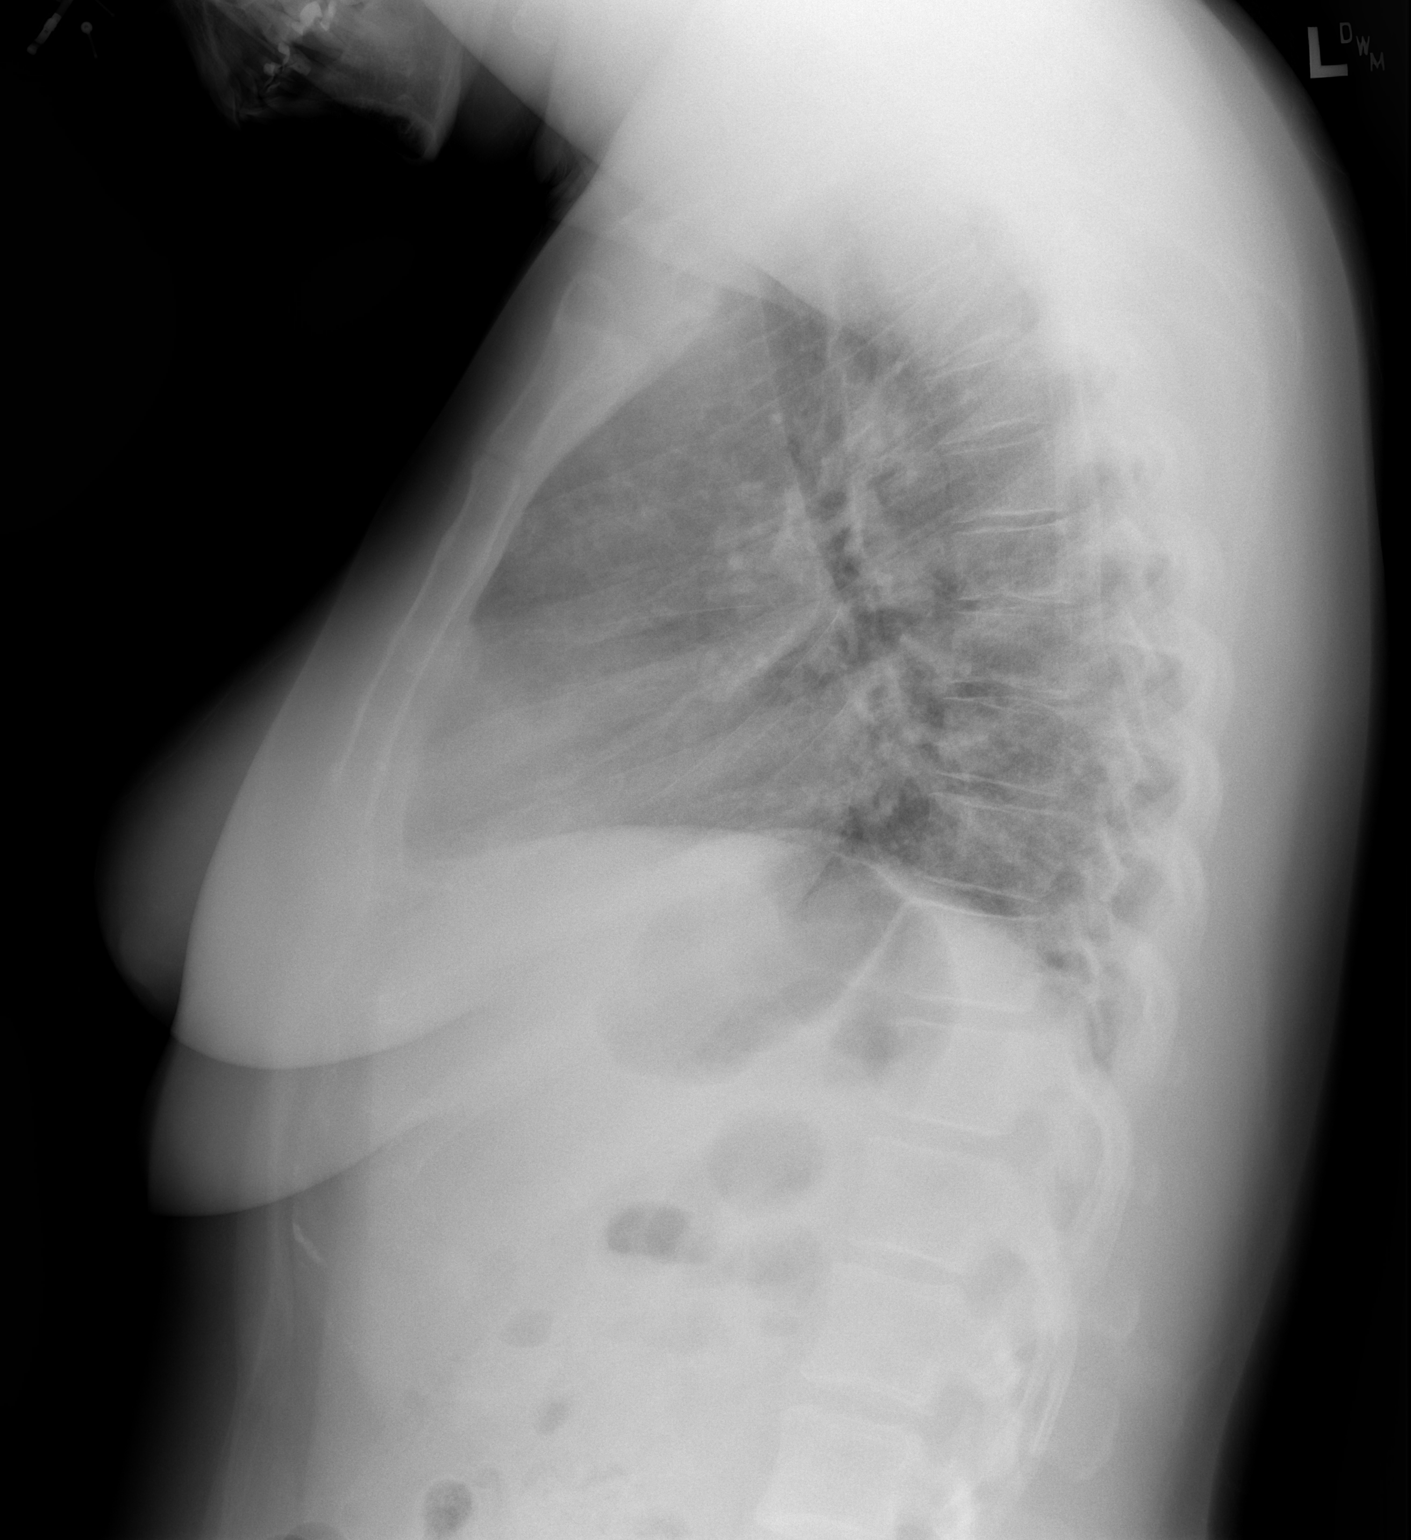

[2 of 2 positions shown; findings below may reference images not displayed]

FINDINGS: The lungs are well-aerated. Pulmonary vascularity is at the upper
limits of normal. Mild right basilar opacity may reflect pneumonia.
There is no evidence of pleural effusion or pneumothorax.

The heart is normal in size; the mediastinal contour is within
normal limits. No acute osseous abnormalities are seen.
IMPRESSION: Mild right basilar opacity may reflect pneumonia.

## 2017-07-20 DIAGNOSIS — N61 Mastitis without abscess: Secondary | ICD-10-CM | POA: Diagnosis not present

## 2017-07-22 DIAGNOSIS — N6489 Other specified disorders of breast: Secondary | ICD-10-CM | POA: Diagnosis not present

## 2017-07-22 DIAGNOSIS — N644 Mastodynia: Secondary | ICD-10-CM | POA: Diagnosis not present

## 2017-07-22 DIAGNOSIS — N61 Mastitis without abscess: Secondary | ICD-10-CM | POA: Diagnosis not present

## 2017-07-25 DIAGNOSIS — N61 Mastitis without abscess: Secondary | ICD-10-CM | POA: Diagnosis not present

## 2017-07-25 DIAGNOSIS — N611 Abscess of the breast and nipple: Secondary | ICD-10-CM | POA: Diagnosis not present

## 2017-08-01 DIAGNOSIS — N61 Mastitis without abscess: Secondary | ICD-10-CM | POA: Diagnosis not present

## 2017-08-15 DIAGNOSIS — N61 Mastitis without abscess: Secondary | ICD-10-CM | POA: Diagnosis not present

## 2017-09-07 DIAGNOSIS — M5416 Radiculopathy, lumbar region: Secondary | ICD-10-CM | POA: Diagnosis not present

## 2017-09-07 DIAGNOSIS — M2550 Pain in unspecified joint: Secondary | ICD-10-CM | POA: Diagnosis not present

## 2017-09-07 DIAGNOSIS — M47813 Spondylosis without myelopathy or radiculopathy, cervicothoracic region: Secondary | ICD-10-CM | POA: Diagnosis not present

## 2017-09-07 DIAGNOSIS — M545 Low back pain: Secondary | ICD-10-CM | POA: Diagnosis not present

## 2017-09-07 DIAGNOSIS — M5134 Other intervertebral disc degeneration, thoracic region: Secondary | ICD-10-CM | POA: Diagnosis not present

## 2017-09-07 DIAGNOSIS — M438X2 Other specified deforming dorsopathies, cervical region: Secondary | ICD-10-CM | POA: Diagnosis not present

## 2017-09-07 DIAGNOSIS — G43019 Migraine without aura, intractable, without status migrainosus: Secondary | ICD-10-CM | POA: Diagnosis not present

## 2017-09-07 DIAGNOSIS — R7982 Elevated C-reactive protein (CRP): Secondary | ICD-10-CM | POA: Diagnosis not present

## 2017-10-03 DIAGNOSIS — N61 Mastitis without abscess: Secondary | ICD-10-CM | POA: Diagnosis not present

## 2017-11-30 DIAGNOSIS — Z01419 Encounter for gynecological examination (general) (routine) without abnormal findings: Secondary | ICD-10-CM | POA: Diagnosis not present

## 2018-07-04 ENCOUNTER — Encounter (HOSPITAL_BASED_OUTPATIENT_CLINIC_OR_DEPARTMENT_OTHER): Payer: Self-pay | Admitting: *Deleted

## 2018-07-04 ENCOUNTER — Other Ambulatory Visit: Payer: Self-pay

## 2018-07-04 ENCOUNTER — Emergency Department (HOSPITAL_BASED_OUTPATIENT_CLINIC_OR_DEPARTMENT_OTHER)
Admission: EM | Admit: 2018-07-04 | Discharge: 2018-07-04 | Disposition: A | Discharge status: Home / Self Care | Payer: Medicaid Other | Attending: Emergency Medicine | Admitting: Emergency Medicine

## 2018-07-04 DIAGNOSIS — Y999 Unspecified external cause status: Secondary | ICD-10-CM | POA: Insufficient documentation

## 2018-07-04 DIAGNOSIS — Y92410 Unspecified street and highway as the place of occurrence of the external cause: Secondary | ICD-10-CM | POA: Diagnosis not present

## 2018-07-04 DIAGNOSIS — Y9389 Activity, other specified: Secondary | ICD-10-CM | POA: Insufficient documentation

## 2018-07-04 DIAGNOSIS — F17210 Nicotine dependence, cigarettes, uncomplicated: Secondary | ICD-10-CM | POA: Insufficient documentation

## 2018-07-04 DIAGNOSIS — S46811A Strain of other muscles, fascia and tendons at shoulder and upper arm level, right arm, initial encounter: Secondary | ICD-10-CM

## 2018-07-04 DIAGNOSIS — S161XXA Strain of muscle, fascia and tendon at neck level, initial encounter: Secondary | ICD-10-CM | POA: Diagnosis not present

## 2018-07-04 DIAGNOSIS — S199XXA Unspecified injury of neck, initial encounter: Secondary | ICD-10-CM | POA: Diagnosis present

## 2018-07-04 NOTE — Discharge Instructions (Addendum)
Use heat, Tylenol, ice and Motrin as needed for pain and muscle spasm. Return for weakness or new symptoms or signs.
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
## 2020-08-05 ENCOUNTER — Encounter (HOSPITAL_BASED_OUTPATIENT_CLINIC_OR_DEPARTMENT_OTHER): Payer: Self-pay

## 2020-08-05 ENCOUNTER — Other Ambulatory Visit: Payer: Self-pay

## 2020-08-05 DIAGNOSIS — H5711 Ocular pain, right eye: Secondary | ICD-10-CM | POA: Diagnosis not present

## 2020-08-05 DIAGNOSIS — F17210 Nicotine dependence, cigarettes, uncomplicated: Secondary | ICD-10-CM | POA: Diagnosis not present

## 2020-08-05 NOTE — ED Triage Notes (Addendum)
Pt c/o pain, swelling, redness, drainage to right eye started yesterday-denies injury-states she was seen at [facility] to go to [facility] due to wait-NAD-steady gait

## 2020-08-06 ENCOUNTER — Emergency Department (HOSPITAL_BASED_OUTPATIENT_CLINIC_OR_DEPARTMENT_OTHER)
Admission: EM | Admit: 2020-08-06 | Discharge: 2020-08-06 | Disposition: A | Discharge status: Home / Self Care | Payer: 59 | Attending: Emergency Medicine | Admitting: Emergency Medicine

## 2020-08-06 DIAGNOSIS — H16001 Unspecified corneal ulcer, right eye: Secondary | ICD-10-CM | POA: Diagnosis not present

## 2020-08-06 DIAGNOSIS — H5711 Ocular pain, right eye: Secondary | ICD-10-CM

## 2020-08-06 DIAGNOSIS — F17210 Nicotine dependence, cigarettes, uncomplicated: Secondary | ICD-10-CM | POA: Diagnosis not present

## 2020-08-06 MED ORDER — TETRACAINE HCL 0.5 % OP SOLN
OPHTHALMIC | Status: AC
  Filled 2020-08-06: qty 4

## 2020-08-06 MED ORDER — FLUORESCEIN SODIUM 1 MG OP STRP
ORAL_STRIP | OPHTHALMIC | Status: AC
  Administered 2020-08-06: 1 via OPHTHALMIC
  Filled 2020-08-06: qty 1

## 2020-08-06 MED ORDER — FLUORESCEIN SODIUM 1 MG OP STRP: 1.0000 | ORAL_STRIP | Freq: Once | OPHTHALMIC | Status: AC

## 2020-08-06 MED ORDER — TETRACAINE HCL 0.5 % OP SOLN
2.0000 [drp] | Freq: Once | OPHTHALMIC | Status: AC
  Administered 2020-08-06: 2 [drp] via OPHTHALMIC

## 2020-08-06 NOTE — ED Provider Notes (Signed)
[facility] [facility] Provider Note: Georgena Spurling, MD, FACEP  CSN: [id number] MRN: [id number] ARRIVAL: 08/05/20 at 2218 ROOM: [facility]   HISTORY OF PRESENT ILLNESS  08/06/20 1:18 AM Wendy Weaver is a 35 y.o. female with pain in her right eye for the past 2 days.  She rates the pain as a 3 out of 10 at rest but with light to become severe.  She has had erythema of the right conjunctiva as well as a watery exudate.  She is not aware of any injury.  She has no history of eye problems.   Past Medical History:  Diagnosis Date   Back pain    Hip pain     Past Surgical History:  Procedure Laterality Date   arm surgery      No family history on file.  Social History   Tobacco Use   Smoking status: Every Day    Packs/day: 0.50    Types: Cigarettes   Smokeless tobacco: Never  Vaping Use   Vaping Use: Never used  Substance Use Topics   Alcohol use: Yes    Comment: occ   Drug use: No    Prior to Admission medications   Not on File    Allergies Patient has no known allergies.   REVIEW OF SYSTEMS  Negative except as noted here or in the History of Present Illness.   PHYSICAL EXAMINATION  Initial Vital Signs Blood pressure 130/79, pulse 79, temperature 98.6 F (37 C), temperature source Oral, resp. rate 20, height 5\' 7"  (1.702 m), weight 93 kg, last menstrual period 07/12/2020, SpO2 99 %, unknown if currently breastfeeding.  Examination General: Well-developed, well-nourished female in no acute distress; appearance consistent with age of record HENT: normocephalic; atraumatic Eyes: pupils equal, round and reactive to light; right direct and consensual photophobia; right conjunctival injection; right serous discharge; no fluorescein uptake; IOP 24R, 26L; extraocular muscles intact Neck: supple Heart: regular rate and rhythm Lungs: clear to auscultation bilaterally Abdomen: soft; nondistended; nontender; bowel sounds  present Extremities: No deformity; full range of motion Neurologic: Awake, alert and oriented; motor function intact in all extremities and symmetric; no facial droop Skin: Warm and dry Psychiatric: Normal mood and affect   RESULTS  Summary of this visit's results, reviewed and interpreted by myself:   EKG Interpretation  Date/Time:    Ventricular Rate:    PR Interval:    QRS Duration:   QT Interval:    QTC Calculation:   R Axis:     Text Interpretation:         Laboratory Studies: No results found for this or any previous visit (from the past 24 hour(s)). Imaging Studies: No results found.  ED COURSE and MDM  Nursing notes, initial and subsequent vitals signs, including pulse oximetry, reviewed and interpreted by myself.  Vitals:   08/05/20 2236 08/05/20 2240  BP:  130/79  Pulse:  79  Resp:  20  Temp:  98.6 F (37 C)  TempSrc:  Oral  SpO2:  99%  Weight: 93 kg   Height: 5\' 7"  (1.702 m)    Medications  tetracaine (PONTOCAINE) 0.5 % ophthalmic solution 2 drop (2 drops Right Eye Given by Other 08/06/20 0128)  fluorescein ophthalmic strip 1 strip (1 strip Right Eye Given by Other 08/06/20 0132)   Discussed with Dr. Talbert Forest.  He will see the patient in his office at 5 AM today (2 hours from now).  He is not  sure this represents glaucoma as sometimes Tono-Pen readings can be elevated when the patient squeezes against the Tono-Pen which this patient did.  It does not appear to be acute angle-closure glaucoma as she has a reactive pupil on the affected side and she is nearsighted which it makes angle-closure glaucoma less likely.  Iritis could be causing her symptoms, especially the photophobia.   PROCEDURES  Procedures   ED DIAGNOSES     ICD-10-CM   1. Acute pain in right eye  H57.11          Araly Kaas, Jenny Reichmann, MD 08/06/20 [phone]

## 2020-08-13 DIAGNOSIS — H179 Unspecified corneal scar and opacity: Secondary | ICD-10-CM | POA: Diagnosis not present

## 2020-08-13 DIAGNOSIS — H16001 Unspecified corneal ulcer, right eye: Secondary | ICD-10-CM | POA: Diagnosis not present

## 2020-08-24 DIAGNOSIS — H179 Unspecified corneal scar and opacity: Secondary | ICD-10-CM | POA: Diagnosis not present

## 2020-08-24 DIAGNOSIS — H16001 Unspecified corneal ulcer, right eye: Secondary | ICD-10-CM | POA: Diagnosis not present

## 2020-11-25 DIAGNOSIS — Z114 Encounter for screening for human immunodeficiency virus [HIV]: Secondary | ICD-10-CM | POA: Diagnosis not present

## 2020-11-25 DIAGNOSIS — Z01419 Encounter for gynecological examination (general) (routine) without abnormal findings: Secondary | ICD-10-CM | POA: Diagnosis not present

## 2020-11-25 DIAGNOSIS — Z113 Encounter for screening for infections with a predominantly sexual mode of transmission: Secondary | ICD-10-CM | POA: Diagnosis not present

## 2020-11-25 DIAGNOSIS — Z1159 Encounter for screening for other viral diseases: Secondary | ICD-10-CM | POA: Diagnosis not present

## 2021-02-05 DIAGNOSIS — B079 Viral wart, unspecified: Secondary | ICD-10-CM | POA: Diagnosis not present

## 2021-03-01 DIAGNOSIS — R52 Pain, unspecified: Secondary | ICD-10-CM | POA: Diagnosis not present

## 2021-03-01 DIAGNOSIS — B078 Other viral warts: Secondary | ICD-10-CM | POA: Diagnosis not present

## 2021-03-01 DIAGNOSIS — D489 Neoplasm of uncertain behavior, unspecified: Secondary | ICD-10-CM | POA: Diagnosis not present

## 2021-03-01 DIAGNOSIS — M79644 Pain in right finger(s): Secondary | ICD-10-CM | POA: Diagnosis not present

## 2021-03-10 DIAGNOSIS — R030 Elevated blood-pressure reading, without diagnosis of hypertension: Secondary | ICD-10-CM | POA: Diagnosis not present

## 2021-03-10 DIAGNOSIS — M25649 Stiffness of unspecified hand, not elsewhere classified: Secondary | ICD-10-CM | POA: Diagnosis not present

## 2021-03-10 DIAGNOSIS — M20009 Unspecified deformity of unspecified finger(s): Secondary | ICD-10-CM | POA: Diagnosis not present

## 2021-03-10 DIAGNOSIS — M79642 Pain in left hand: Secondary | ICD-10-CM | POA: Diagnosis not present

## 2021-03-10 DIAGNOSIS — F17200 Nicotine dependence, unspecified, uncomplicated: Secondary | ICD-10-CM | POA: Diagnosis not present

## 2021-03-10 DIAGNOSIS — M19042 Primary osteoarthritis, left hand: Secondary | ICD-10-CM | POA: Diagnosis not present

## 2021-03-10 DIAGNOSIS — M79641 Pain in right hand: Secondary | ICD-10-CM | POA: Diagnosis not present

## 2021-03-10 DIAGNOSIS — M19041 Primary osteoarthritis, right hand: Secondary | ICD-10-CM | POA: Diagnosis not present

## 2021-04-05 ENCOUNTER — Other Ambulatory Visit (HOSPITAL_COMMUNITY): Payer: Self-pay

## 2021-04-05 MED ORDER — MELOXICAM 15 MG PO TABS
ORAL_TABLET | ORAL | 1 refills | Status: DC
  Filled 2021-04-05: qty 30, 30d supply, fill #0
  Filled 2021-07-19: qty 30, 30d supply, fill #1

## 2021-04-08 ENCOUNTER — Other Ambulatory Visit (HOSPITAL_COMMUNITY): Payer: Self-pay

## 2021-04-29 DIAGNOSIS — Z13220 Encounter for screening for lipoid disorders: Secondary | ICD-10-CM | POA: Diagnosis not present

## 2021-04-29 DIAGNOSIS — Z1329 Encounter for screening for other suspected endocrine disorder: Secondary | ICD-10-CM | POA: Diagnosis not present

## 2021-04-29 DIAGNOSIS — Z0000 Encounter for general adult medical examination without abnormal findings: Secondary | ICD-10-CM | POA: Diagnosis not present

## 2021-04-29 DIAGNOSIS — R7303 Prediabetes: Secondary | ICD-10-CM | POA: Diagnosis not present

## 2021-04-29 DIAGNOSIS — Z23 Encounter for immunization: Secondary | ICD-10-CM | POA: Diagnosis not present

## 2021-05-13 DIAGNOSIS — M2550 Pain in unspecified joint: Secondary | ICD-10-CM | POA: Diagnosis not present

## 2021-05-13 DIAGNOSIS — R700 Elevated erythrocyte sedimentation rate: Secondary | ICD-10-CM | POA: Diagnosis not present

## 2021-05-31 DIAGNOSIS — B079 Viral wart, unspecified: Secondary | ICD-10-CM | POA: Diagnosis not present

## 2021-06-18 DIAGNOSIS — M62838 Other muscle spasm: Secondary | ICD-10-CM | POA: Diagnosis not present

## 2021-06-18 DIAGNOSIS — M546 Pain in thoracic spine: Secondary | ICD-10-CM | POA: Diagnosis not present

## 2021-06-18 DIAGNOSIS — M542 Cervicalgia: Secondary | ICD-10-CM | POA: Diagnosis not present

## 2021-07-19 ENCOUNTER — Other Ambulatory Visit (HOSPITAL_COMMUNITY): Payer: Self-pay

## 2021-07-22 DIAGNOSIS — B079 Viral wart, unspecified: Secondary | ICD-10-CM | POA: Diagnosis not present

## 2021-08-04 DIAGNOSIS — J301 Allergic rhinitis due to pollen: Secondary | ICD-10-CM | POA: Diagnosis not present

## 2021-08-04 DIAGNOSIS — M549 Dorsalgia, unspecified: Secondary | ICD-10-CM | POA: Diagnosis not present

## 2021-09-03 ENCOUNTER — Emergency Department (HOSPITAL_BASED_OUTPATIENT_CLINIC_OR_DEPARTMENT_OTHER)
Admission: EM | Admit: 2021-09-03 | Discharge: 2021-09-04 | Disposition: A | Discharge status: Home / Self Care | Payer: 59 | Attending: Emergency Medicine | Admitting: Emergency Medicine

## 2021-09-03 ENCOUNTER — Encounter (HOSPITAL_BASED_OUTPATIENT_CLINIC_OR_DEPARTMENT_OTHER): Payer: Self-pay | Admitting: Emergency Medicine

## 2021-09-03 DIAGNOSIS — W260XXA Contact with knife, initial encounter: Secondary | ICD-10-CM | POA: Insufficient documentation

## 2021-09-03 DIAGNOSIS — Z23 Encounter for immunization: Secondary | ICD-10-CM | POA: Insufficient documentation

## 2021-09-03 DIAGNOSIS — S61213A Laceration without foreign body of left middle finger without damage to nail, initial encounter: Secondary | ICD-10-CM | POA: Insufficient documentation

## 2021-09-03 DIAGNOSIS — S6992XA Unspecified injury of left wrist, hand and finger(s), initial encounter: Secondary | ICD-10-CM | POA: Diagnosis present

## 2021-09-03 NOTE — ED Triage Notes (Signed)
Pt w/ laceration to LT middle finger from knife while cooking tonight

## 2021-09-04 DIAGNOSIS — Z23 Encounter for immunization: Secondary | ICD-10-CM | POA: Diagnosis not present

## 2021-09-04 DIAGNOSIS — S61213A Laceration without foreign body of left middle finger without damage to nail, initial encounter: Secondary | ICD-10-CM | POA: Diagnosis not present

## 2021-09-04 MED ORDER — LIDOCAINE HCL 2 % IJ SOLN
10.0000 mL | Freq: Once | INTRAMUSCULAR | Status: AC
Start: 2021-09-04 — End: 2021-09-04
  Administered 2021-09-04: 200 mg
  Filled 2021-09-04: qty 20

## 2021-09-04 MED ORDER — TETANUS-DIPHTH-ACELL PERTUSSIS 5-2.5-18.5 LF-MCG/0.5 IM SUSY
0.5000 mL | PREFILLED_SYRINGE | Freq: Once | INTRAMUSCULAR | Status: AC
  Administered 2021-09-04: 0.5 mL via INTRAMUSCULAR
  Filled 2021-09-04: qty 0.5

## 2021-09-04 NOTE — ED Provider Notes (Signed)
[place] EMERGENCY DEPARTMENT  Provider Note  CSN: [id number] Arrival date & time: 09/03/21 2034  History Chief Complaint  Patient presents with   Laceration    Wendy Weaver is a 36 y.o. female reports she accidentally cut her L middle finger while preparing dinner. TDAP is >5 years.    Home Medications Prior to Admission medications   Medication Sig Start Date End Date Taking? Authorizing Provider  meloxicam (MOBIC) 15 MG tablet Take 1 tablet by mouth daily. 03/10/21        Allergies    Patient has no known allergies.   Review of Systems   Review of Systems Please see HPI for pertinent positives and negatives  Physical Exam BP 130/87   Pulse 67   Temp 98.4 F (36.9 C) (Oral)   Resp 18   Ht '5\' 7"'$  (1.702 m)   Wt 93 kg   LMP 08/19/2021   SpO2 100%   BMI 32.11 kg/m   Physical Exam Vitals and nursing note reviewed.  HENT:     Head: Normocephalic.     Nose: Nose normal.  Eyes:     Extraocular Movements: Extraocular movements intact.  Pulmonary:     Effort: Pulmonary effort is normal.  Musculoskeletal:        General: Normal range of motion.     Cervical back: Neck supple.     Comments: 2.5cm laceration to L 3rd palmar middle phalax, able to flex but limited by pain   Skin:    Findings: No rash (on exposed skin).  Neurological:     Mental Status: She is alert and oriented to person, place, and time.  Psychiatric:        Mood and Affect: Mood normal.     ED Results / Procedures / Treatments   EKG None  Procedures .Marland KitchenLaceration Repair  Date/Time: 09/04/2021 1:38 AM  Performed by: Truddie Hidden, MD Authorized by: Truddie Hidden, MD   Consent:    Consent obtained:  Verbal   Consent given by:  Patient Anesthesia:    Anesthesia method:  Local infiltration and nerve block   Local anesthetic:  Lidocaine 2% w/o epi   Block technique:  Digital and local Laceration details:    Location:  Finger   Finger location:  L long  finger   Length (cm):  2.5 Pre-procedure details:    Preparation:  Patient was prepped and draped in usual sterile fashion Exploration:    Wound exploration: wound explored through full range of motion     Wound extent: no muscle damage noted, no nerve damage noted and no tendon damage noted   Treatment:    Area cleansed with:  Povidone-iodine and saline   Amount of cleaning:  Standard   Irrigation solution:  Sterile saline   Debridement:  Minimal Skin repair:    Repair method:  Sutures   Suture size:  5-0   Suture material:  Nylon   Number of sutures:  4 Approximation:    Approximation:  Close Repair type:    Repair type:  Simple Post-procedure details:    Procedure completion:  Tolerated well, no immediate complications   Medications Ordered in the ED Medications  lidocaine (XYLOCAINE) 2 % (with pres) injection 200 mg (200 mg Infiltration Given 09/04/21 0029)  Tdap (BOOSTRIX) injection 0.5 mL (0.5 mLs Intramuscular Given 09/04/21 0026)    Initial Impression and Plan  Wound repaired, wound care instructions given. TDAP updated. Suture removal in 7 days.  ED Course       MDM Rules/Calculators/A&P Medical Decision Making Problems Addressed: Laceration of left middle finger without foreign body without damage to nail, initial encounter: acute illness or injury  Risk Prescription drug management.    Final Clinical Impression(s) / ED Diagnoses Final diagnoses:  Laceration of left middle finger without foreign body without damage to nail, initial encounter    Rx / DC Orders ED Discharge Orders     None        Truddie Hidden, MD 09/04/21 0140

## 2021-09-12 DIAGNOSIS — Z4802 Encounter for removal of sutures: Secondary | ICD-10-CM | POA: Diagnosis not present

## 2021-09-30 DIAGNOSIS — M545 Low back pain, unspecified: Secondary | ICD-10-CM | POA: Diagnosis not present

## 2021-09-30 DIAGNOSIS — R053 Chronic cough: Secondary | ICD-10-CM | POA: Diagnosis not present

## 2021-09-30 DIAGNOSIS — G8929 Other chronic pain: Secondary | ICD-10-CM | POA: Diagnosis not present

## 2021-09-30 DIAGNOSIS — J302 Other seasonal allergic rhinitis: Secondary | ICD-10-CM | POA: Diagnosis not present

## 2021-09-30 DIAGNOSIS — Z23 Encounter for immunization: Secondary | ICD-10-CM | POA: Diagnosis not present

## 2021-10-03 DIAGNOSIS — M542 Cervicalgia: Secondary | ICD-10-CM | POA: Diagnosis not present

## 2021-10-12 DIAGNOSIS — R053 Chronic cough: Secondary | ICD-10-CM | POA: Diagnosis not present

## 2021-10-12 DIAGNOSIS — R0602 Shortness of breath: Secondary | ICD-10-CM | POA: Diagnosis not present

## 2021-10-12 DIAGNOSIS — R059 Cough, unspecified: Secondary | ICD-10-CM | POA: Diagnosis not present

## 2021-10-14 DIAGNOSIS — M47816 Spondylosis without myelopathy or radiculopathy, lumbar region: Secondary | ICD-10-CM | POA: Diagnosis not present

## 2021-10-14 DIAGNOSIS — M533 Sacrococcygeal disorders, not elsewhere classified: Secondary | ICD-10-CM | POA: Diagnosis not present

## 2021-10-14 DIAGNOSIS — M545 Low back pain, unspecified: Secondary | ICD-10-CM | POA: Diagnosis not present

## 2021-10-14 DIAGNOSIS — G8929 Other chronic pain: Secondary | ICD-10-CM | POA: Diagnosis not present

## 2021-10-14 DIAGNOSIS — M542 Cervicalgia: Secondary | ICD-10-CM | POA: Diagnosis not present

## 2021-10-31 DIAGNOSIS — R053 Chronic cough: Secondary | ICD-10-CM | POA: Diagnosis not present

## 2021-11-08 DIAGNOSIS — Z7409 Other reduced mobility: Secondary | ICD-10-CM | POA: Diagnosis not present

## 2021-11-08 DIAGNOSIS — R293 Abnormal posture: Secondary | ICD-10-CM | POA: Diagnosis not present

## 2021-11-08 DIAGNOSIS — M47816 Spondylosis without myelopathy or radiculopathy, lumbar region: Secondary | ICD-10-CM | POA: Diagnosis not present

## 2021-11-08 DIAGNOSIS — M2560 Stiffness of unspecified joint, not elsewhere classified: Secondary | ICD-10-CM | POA: Diagnosis not present

## 2021-11-08 DIAGNOSIS — R531 Weakness: Secondary | ICD-10-CM | POA: Diagnosis not present

## 2021-11-17 DIAGNOSIS — M47816 Spondylosis without myelopathy or radiculopathy, lumbar region: Secondary | ICD-10-CM | POA: Diagnosis not present

## 2021-11-17 DIAGNOSIS — M2560 Stiffness of unspecified joint, not elsewhere classified: Secondary | ICD-10-CM | POA: Diagnosis not present

## 2021-11-17 DIAGNOSIS — R293 Abnormal posture: Secondary | ICD-10-CM | POA: Diagnosis not present

## 2021-11-17 DIAGNOSIS — Z7409 Other reduced mobility: Secondary | ICD-10-CM | POA: Diagnosis not present

## 2021-11-17 DIAGNOSIS — R531 Weakness: Secondary | ICD-10-CM | POA: Diagnosis not present

## 2021-11-25 NOTE — Progress Notes (Signed)
Office Visit Note  Patient: Wendy Weaver             Date of Birth: Nov 30, 1985           MRN: [id number]             PCP: Jenel Lucks, PA-C Referring: Burna Cash* Visit Date: 12/09/2021 Occupation: _0 @  Subjective:  Pain in multiple joints  History of Present Illness: Wendy Weaver is a 36 y.o. female in consultation per request of her PCP.  According the patient her symptoms started soon after she gave birth to her twins in 2014.  She states that she started having lower back pain mostly in her SI joints which was radiating to her right leg.  She was taking Tylenol ibuprofen and tramadol prescribed by the back specialist.  She was also given a course of prednisone which helped only for the duration she took the prednisone and then the pain recurred.  She recalls being seen by rheumatologist in [facility] who did labs and her C-reactive protein was elevated at the time.  She states in 2016 she started having pain in her bilateral wrist joints.  Her right wrist joint was more painful and she was evaluated by an orthopedic surgeon.  She had several injections to her right wrist joint without much relief.  She had MRI of her wrist joint according to the patient which showed carpal tunnel syndrome and torn ligament.  She had surgery on her right wrist joint.  She has plate and screws in her right wrist.  She states she continues to have pain and discomfort in her right wrist and also her left wrist hurts.  She has been having pain and discomfort in her bilateral hands over time.  She states in 2019 she went to the emergency room with bilateral knee joint pain at the time the ultrasound showed ruptured Baker's cyst in her right knee joint and also a Baker's cyst in her left knee joint.  She continues to have discomfort in her knee joints but she has not noticed joint swelling.  She has ongoing pain and discomfort in her SI joints.  She has been going to pain  management and had cortisone injection in her SI joints about 2 weeks ago.  She did not notice any improvement from the SI joint injections.  She was also evaluated by the rheumatologist in [facility] recently.  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  She was advised immunosuppressive therapy but patient declined.  She has significant morning stiffness lasting for about an hour.  She denies pain and swelling in any other joints.  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  She previously was working as Consulting civil engineer at [employer].  She is gravida 4, para 4, miscarriage 1.  There is no family history of autoimmune disease.  Patient denies any history of rash, Achilles tendinitis, Planter fasciitis or uveitis.  Activities of Daily Living:  Patient reports morning stiffness for 1 hour.   Patient Reports nocturnal pain.  Difficulty dressing/grooming: Denies Difficulty climbing stairs: Reports Difficulty getting out of chair: Reports Difficulty using hands for taps, buttons, cutlery, and/or writing: Denies  Review of Systems  Constitutional:  Negative for fatigue.  HENT:  Negative for mouth sores and mouth dryness.   Eyes:  Negative for dryness.  Respiratory:  Negative for shortness of  breath.   Cardiovascular:  Negative for chest pain and palpitations.  Gastrointestinal:  Positive for constipation. Negative for blood in stool and diarrhea.  Endocrine: Negative for increased urination.  Genitourinary:  Negative for involuntary urination.  Musculoskeletal:  Positive for joint pain, joint pain, myalgias, morning stiffness, muscle tenderness and myalgias. Negative for gait problem, joint swelling and muscle weakness.  Skin:  Negative for color change, rash and sensitivity to sunlight.  Allergic/Immunologic: Negative for susceptible to infections.  Neurological:   Negative for dizziness and headaches.  Hematological:  Negative for swollen glands.  Psychiatric/Behavioral:  Positive for sleep disturbance. Negative for depressed mood. The patient is not nervous/anxious.     PMFS History:  Patient Active Problem List   Diagnosis Date Noted   Hx of migraines 12/09/2021   Chronic idiopathic urticaria 12/09/2021    Past Medical History:  Diagnosis Date   Back pain    Hip pain    Sickle cell trait ([place])     Family History  Problem Relation Age of Onset   Diabetes Mother    Hypertension Mother    Arthritis Mother    Hypertension Father    Hypertension Sister    Past Surgical History:  Procedure Laterality Date   arm surgery Right    right wrist   Social History   Social History Narrative   Not on file   Immunization History  Administered Date(s) Administered   Tdap 09/04/2021     Objective: Vital Signs: BP 131/83 (BP Location: Right Arm, Patient Position: Sitting, Cuff Size: Normal)   Pulse 78   Resp 17   Ht 5' 7.5" (1.715 m)   Wt 200 lb 12.8 oz (91.1 kg)   BMI 30.99 kg/m    Physical Exam Vitals and nursing note reviewed.  Constitutional:      Appearance: She is well-developed.  HENT:     Head: Normocephalic and atraumatic.  Eyes:     Conjunctiva/sclera: Conjunctivae normal.  Cardiovascular:     Rate and Rhythm: Normal rate and regular rhythm.     Heart sounds: Normal heart sounds.  Pulmonary:     Effort: Pulmonary effort is normal.     Breath sounds: Normal breath sounds.  Abdominal:     General: Bowel sounds are normal.     Palpations: Abdomen is soft.  Musculoskeletal:     Cervical back: Normal range of motion.  Lymphadenopathy:     Cervical: No cervical adenopathy.  Skin:    General: Skin is warm and dry.     Capillary Refill: Capillary refill takes less than 2 seconds.  Neurological:     Mental Status: She is alert and oriented to person, place, and time.  Psychiatric:        Behavior: Behavior normal.      Musculoskeletal Exam: Cervical, thoracic and lumbar spine were in good range of motion.  She had some discomfort in the right paravertebral lumbar region.  She had tenderness over bilateral SI joint and gluteal region.  Shoulder joints, elbow joints were in good range of motion.  She had limited extension of her right wrist joint without any synovitis.  There was no synovitis of her bilateral wrist joints or MCP joints.  Hyperextension of DIP joints was noted.  No synovitis or DIPs or PIPs was noted.  Hip joints and knee joints in good range of motion.  No warmth swelling or effusion was noted in the knee joints.  There was no tenderness over ankles or MTPs.  No synovitis was noted.  CDAI Exam: CDAI Score: -- Patient Global: --; Provider Global: -- Swollen: --; Tender: -- Joint Exam 12/09/2021   No joint exam has been documented for this visit   There is currently no information documented on the homunculus. Go to the Rheumatology activity and complete the homunculus joint exam.  Investigation: No additional findings.    Imaging: No results found.  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 X-ray of the cervical spine was unremarkable. October 20, 2021 chest x-ray unremarkable. October 14, 2021 lumbar spine x-ray negative.  Recent Labs: Lab Results  Component Value Date   WBC 11.6 (H) 08/10/2011   HGB 11.5 (L) 08/10/2011   PLT 332 08/10/2011    Speciality Comments: No specialty comments available.  Procedures:  No procedures performed Allergies: Patient has no known allergies.   Assessment / Plan:     Visit Diagnoses: Bilateral hand pain - 12/23/18: RF negative, ESR 40. XR 03/10/21 of both hands mild OA, with diffuse distal interphalangeal joint space narrowing seen bilaterally and symmetrically -patient complains of intermittent pain and swelling in her hands.  No  synovitis was noted on the examination.  She had hyperextension of her DIP joints.  I will obtain x-rays and labs today.  Plan: XR Hand 2 View Right, XR Hand 2 View Left, x-rays bilateral hands were unremarkable.  Sedimentation rate, Rheumatoid factor, Cyclic citrul peptide antibody, IgG, ANA.  We will discuss results at the follow-up visit.  May consider ultrasound of bilateral hands if the lab results are negative.  Chronic pain of both knees -she complains of intermittent discomfort in her bilateral knee joints.  Patient states she was diagnosed with Baker's cyst in bilateral knee joints in 2018.  She states the right Baker's cyst was ruptured.  Baker's cyst was not palpable today.  Plan: XR KNEE 3 VIEW RIGHT, XR KNEE 3 VIEW LEFT.  X-rays of bilateral knee joints were unremarkable.  Chronic right-sided lower back pain without sciatica-she complains of paravertebral discomfort in the lower lumbar region.  She had x-rays of her lumbar spine on October 14, 2021 which were read as negative by the radiologist.  Chronic SI joint pain -she complains of chronic discomfort in her SI joints.  She had cortisone injections recently at pain management without much relief.  Plan: XR Pelvis 1-2 Views.  X-rays of pelvis showed normal SI joints and normal hip joints.  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  Other fatigue -she complains of ongoing fatigue.  Plan: CBC with Differential/Platelet, COMPLETE METABOLIC PANEL WITH GFR  Chronic idiopathic urticaria  Hx of migraines  Smoker - 1/2 PPD x 15 years.  Walking cessation was discussed.  Association of smoking with rheumatoid arthritis was discussed.  Orders: Orders Placed This Encounter  Procedures   XR Hand 2 View Right   XR Hand 2 View Left   XR Pelvis 1-2 Views   XR KNEE 3 VIEW RIGHT   XR KNEE 3 VIEW LEFT   CBC with Differential/Platelet   COMPLETE METABOLIC PANEL WITH GFR   Sedimentation rate    Rheumatoid factor   Cyclic citrul peptide antibody, IgG   ANA   No orders of the defined types were placed in this encounter.    Follow-Up Instructions: Return for Pain in multiple joints.   Bo Merino, MD  Note - This record has  been created using [employer].  Chart creation errors have been sought, but may not always  have been located. Such creation errors do not reflect on  the standard of medical care.

## 2021-11-28 DIAGNOSIS — R531 Weakness: Secondary | ICD-10-CM | POA: Diagnosis not present

## 2021-11-28 DIAGNOSIS — Z7409 Other reduced mobility: Secondary | ICD-10-CM | POA: Diagnosis not present

## 2021-11-28 DIAGNOSIS — R293 Abnormal posture: Secondary | ICD-10-CM | POA: Diagnosis not present

## 2021-11-28 DIAGNOSIS — M2560 Stiffness of unspecified joint, not elsewhere classified: Secondary | ICD-10-CM | POA: Diagnosis not present

## 2021-11-28 DIAGNOSIS — M47816 Spondylosis without myelopathy or radiculopathy, lumbar region: Secondary | ICD-10-CM | POA: Diagnosis not present

## 2021-11-28 DIAGNOSIS — M533 Sacrococcygeal disorders, not elsewhere classified: Secondary | ICD-10-CM | POA: Diagnosis not present

## 2021-12-09 ENCOUNTER — Ambulatory Visit (INDEPENDENT_AMBULATORY_CARE_PROVIDER_SITE_OTHER): Payer: 59

## 2021-12-09 ENCOUNTER — Ambulatory Visit: Payer: 59 | Attending: Rheumatology | Admitting: Rheumatology

## 2021-12-09 ENCOUNTER — Encounter: Payer: Self-pay | Admitting: Rheumatology

## 2021-12-09 VITALS — BP 131/83 | HR 78 | Resp 17 | Ht 67.5 in | Wt 200.8 lb

## 2021-12-09 DIAGNOSIS — M533 Sacrococcygeal disorders, not elsewhere classified: Secondary | ICD-10-CM

## 2021-12-09 DIAGNOSIS — Z8669 Personal history of other diseases of the nervous system and sense organs: Secondary | ICD-10-CM | POA: Diagnosis not present

## 2021-12-09 DIAGNOSIS — G8929 Other chronic pain: Secondary | ICD-10-CM

## 2021-12-09 DIAGNOSIS — R5383 Other fatigue: Secondary | ICD-10-CM | POA: Diagnosis not present

## 2021-12-09 DIAGNOSIS — M25562 Pain in left knee: Secondary | ICD-10-CM | POA: Diagnosis not present

## 2021-12-09 DIAGNOSIS — M20009 Unspecified deformity of unspecified finger(s): Secondary | ICD-10-CM

## 2021-12-09 DIAGNOSIS — L501 Idiopathic urticaria: Secondary | ICD-10-CM | POA: Diagnosis not present

## 2021-12-09 DIAGNOSIS — F17200 Nicotine dependence, unspecified, uncomplicated: Secondary | ICD-10-CM

## 2021-12-09 DIAGNOSIS — M25561 Pain in right knee: Secondary | ICD-10-CM | POA: Diagnosis not present

## 2021-12-09 DIAGNOSIS — M222X1 Patellofemoral disorders, right knee: Secondary | ICD-10-CM

## 2021-12-09 DIAGNOSIS — M545 Low back pain, unspecified: Secondary | ICD-10-CM | POA: Diagnosis not present

## 2021-12-09 DIAGNOSIS — M79642 Pain in left hand: Secondary | ICD-10-CM

## 2021-12-09 DIAGNOSIS — M79641 Pain in right hand: Secondary | ICD-10-CM

## 2021-12-09 DIAGNOSIS — M7122 Synovial cyst of popliteal space [Baker], left knee: Secondary | ICD-10-CM

## 2021-12-09 DIAGNOSIS — M2560 Stiffness of unspecified joint, not elsewhere classified: Secondary | ICD-10-CM

## 2021-12-11 LAB — CBC WITH DIFFERENTIAL/PLATELET
Absolute Monocytes: 524 cells/uL (ref 200–950)
Basophils Absolute: 11 cells/uL (ref 0–200)
Basophils Relative: 0.1 %
Eosinophils Absolute: 0 cells/uL — ABNORMAL LOW (ref 15–500)
Eosinophils Relative: 0 %
HCT: 36.8 % (ref 35.0–45.0)
Hemoglobin: 12.1 g/dL (ref 11.7–15.5)
Lymphs Abs: 4473 cells/uL — ABNORMAL HIGH (ref 850–3900)
MCH: 28.1 pg (ref 27.0–33.0)
MCHC: 32.9 g/dL (ref 32.0–36.0)
MCV: 85.6 fL (ref 80.0–100.0)
MPV: 9.3 fL (ref 7.5–12.5)
Monocytes Relative: 4.9 %
Neutro Abs: 5692 cells/uL (ref 1500–7800)
Neutrophils Relative %: 53.2 %
Platelets: 376 10*3/uL (ref 140–400)
RBC: 4.3 10*6/uL (ref 3.80–5.10)
RDW: 13.9 % (ref 11.0–15.0)
Total Lymphocyte: 41.8 %
WBC: 10.7 10*3/uL (ref 3.8–10.8)

## 2021-12-11 LAB — COMPLETE METABOLIC PANEL WITH GFR
AG Ratio: 1.3 (calc) (ref 1.0–2.5)
ALT: 16 U/L (ref 6–29)
AST: 22 U/L (ref 10–30)
Albumin: 4.3 g/dL (ref 3.6–5.1)
Alkaline phosphatase (APISO): 58 U/L (ref 31–125)
BUN: 8 mg/dL (ref 7–25)
CO2: 26 mmol/L (ref 20–32)
Calcium: 9.4 mg/dL (ref 8.6–10.2)
Chloride: 106 mmol/L (ref 98–110)
Creat: 0.68 mg/dL (ref 0.50–0.97)
Globulin: 3.2 g/dL (calc) (ref 1.9–3.7)
Glucose, Bld: 94 mg/dL (ref 65–99)
Potassium: 4.2 mmol/L (ref 3.5–5.3)
Sodium: 140 mmol/L (ref 135–146)
Total Bilirubin: 0.4 mg/dL (ref 0.2–1.2)
Total Protein: 7.5 g/dL (ref 6.1–8.1)
eGFR: 115 mL/min/{1.73_m2} (ref >=60)

## 2021-12-11 LAB — SEDIMENTATION RATE: Sed Rate: 31 mm/h — ABNORMAL HIGH (ref 0–20)

## 2021-12-11 LAB — ANTI-NUCLEAR AB-TITER (ANA TITER): ANA Titer 1: 1:40 {titer} — ABNORMAL HIGH

## 2021-12-11 LAB — ANA: Anti Nuclear Antibody (ANA): POSITIVE — AB

## 2021-12-11 LAB — RHEUMATOID FACTOR: Rheumatoid fact SerPl-aCnc: <14 IU/mL (ref <14)

## 2021-12-11 LAB — CYCLIC CITRUL PEPTIDE ANTIBODY, IGG: Cyclic Citrullin Peptide Ab: <16 UNITS

## 2021-12-11 NOTE — Progress Notes (Signed)
Sedimentation rate is mildly elevated.  All other labs are unremarkable.  I will discuss results in detail at the follow-up visit.  If patient continues to have pain and swelling in her hands I would recommend scheduling ultrasound of bilateral hands to look for synovitis.

## 2021-12-21 NOTE — Progress Notes (Signed)
Office Visit Note  Patient: Wendy Weaver             Date of Birth: Jun 11, 1985           MRN: [id number]             PCP: Jenel Lucks, PA-C Referring: Burna Cash* Visit Date: 12/30/2021 Occupation: _0 @  Subjective:  Follow-up (Back and bil hip pain)   History of Present Illness: Wendy Weaver is a 36 y.o. female with history of pain in multiple joints.  She returns for follow-up visit today.  She states she continues to have some lower back pain for which she has been going to physical therapy.  She complains of discomfort in her bilateral hands, hips and her knees.  She has not noticed any joint swelling.  She states the pain has eased off to some extent since her last visit.  She still gets quite stiff and has stiffness after prolonged sitting.  Activities of Daily Living:  Patient reports morning stiffness for 2 hours.   Patient Reports nocturnal pain.  Difficulty dressing/grooming: Denies Difficulty climbing stairs: Reports Difficulty getting out of chair: Reports Difficulty using hands for taps, buttons, cutlery, and/or writing: Reports  Review of Systems  Constitutional:  Positive for fatigue.  HENT:  Negative for mouth sores and mouth dryness.   Eyes:  Negative for dryness.  Respiratory:  Negative for shortness of breath.   Cardiovascular:  Negative for chest pain and palpitations.  Gastrointestinal:  Negative for blood in stool, constipation and diarrhea.  Endocrine: Negative for increased urination.  Genitourinary:  Negative for involuntary urination.  Musculoskeletal:  Positive for joint pain, joint pain, myalgias, morning stiffness, muscle tenderness and myalgias. Negative for gait problem, joint swelling and muscle weakness.  Skin:  Negative for color change, rash, hair loss and sensitivity to sunlight.  Allergic/Immunologic: Negative for susceptible to infections.  Neurological:  Positive for headaches. Negative for dizziness.   Hematological:  Negative for swollen glands.  Psychiatric/Behavioral:  Positive for sleep disturbance. Negative for depressed mood. The patient is not nervous/anxious.     PMFS History:  Patient Active Problem List   Diagnosis Date Noted   Hx of migraines 12/09/2021   Chronic idiopathic urticaria 12/09/2021    Past Medical History:  Diagnosis Date   Back pain    Hip pain    Sickle cell trait ([place])     Family History  Problem Relation Age of Onset   Diabetes Mother    Hypertension Mother    Arthritis Mother    Hypertension Father    Hypertension Sister    Past Surgical History:  Procedure Laterality Date   arm surgery Right    right wrist   Social History   Social History Narrative   Not on file   Immunization History  Administered Date(s) Administered   Tdap 09/04/2021     Objective: Vital Signs: BP 132/84 (BP Location: Left Arm, Patient Position: Sitting, Cuff Size: Normal)   Pulse 84   Resp 15   Ht _1  (1.702 m)   Wt 202 lb (91.6 kg)   BMI 31.64 kg/m    Physical Exam Vitals and nursing note reviewed.  Constitutional:      Appearance: She is well-developed.  HENT:     Head: Normocephalic and atraumatic.  Eyes:     Conjunctiva/sclera: Conjunctivae normal.  Cardiovascular:     Rate and Rhythm: Normal rate and regular rhythm.     Heart sounds: Normal heart  sounds.  Pulmonary:     Effort: Pulmonary effort is normal.     Breath sounds: Normal breath sounds.  Abdominal:     General: Bowel sounds are normal.     Palpations: Abdomen is soft.  Musculoskeletal:     Cervical back: Normal range of motion.  Lymphadenopathy:     Cervical: No cervical adenopathy.  Skin:    General: Skin is warm and dry.     Capillary Refill: Capillary refill takes less than 2 seconds.  Neurological:     Mental Status: She is alert and oriented to person, place, and time.  Psychiatric:        Behavior: Behavior normal.      Musculoskeletal Exam: Cervical spine was  in good range of motion.  She had painful limited range of motion of the lumbar spine.  There was no SI joint tenderness.  Shoulder joints, elbow joints, wrist joints, MCPs PIPs and DIPs Juengel range of motion with no synovitis.  Hip joints and knee joints with good range of motion without any warmth swelling or effusion.  There is no tenderness over ankles or MTP joints.  CDAI Exam: CDAI Score: -- Patient Global: --; Provider Global: -- Swollen: --; Tender: -- Joint Exam 12/30/2021   No joint exam has been documented for this visit   There is currently no information documented on the homunculus. Go to the Rheumatology activity and complete the homunculus joint exam.  Investigation: No additional findings.  Imaging: XR Pelvis 1-2 Views  Result Date: 12/09/2021 No SI joint sclerosis or narrowing was noted.  No hip joint narrowing was noted.  Extra-articular calcification was noted around the right hip joint. Impression: Unremarkable x-rays of the SI joints and hip joints.  XR KNEE 3 VIEW LEFT  Result Date: 12/09/2021 No medial or lateral compartment narrowing was noted.  No patellofemoral narrowing was noted.  No chondrocalcinosis was noted. Impression: Unremarkable x-rays of the knee.  XR KNEE 3 VIEW RIGHT  Result Date: 12/09/2021 No medial or lateral compartment narrowing was noted.  No patellofemoral narrowing was noted.  No chondrocalcinosis was noted. Impression: Unremarkable x-rays of the knee.  XR Hand 2 View Left  Result Date: 12/09/2021 No MCP, PIP or DIP narrowing was noted.  No intercarpal or radiocarpal joint space narrowing was noted.  No erosive changes were noted. Impression: Unremarkable x-rays of the hand.  XR Hand 2 View Right  Result Date: 12/09/2021 No MCP, PIP or DIP narrowing was noted.  No intercarpal or radiocarpal joint space narrowing was noted.  No erosive changes were noted. Impression: Unremarkable x-rays of the hand.   Recent Labs: Lab Results   Component Value Date   WBC 10.7 12/09/2021   HGB 12.1 12/09/2021   PLT 376 12/09/2021   NA 140 12/09/2021   K 4.2 12/09/2021   CL 106 12/09/2021   CO2 26 12/09/2021   GLUCOSE 94 12/09/2021   BUN 8 12/09/2021   CREATININE 0.68 12/09/2021   BILITOT 0.4 12/09/2021   AST 22 12/09/2021   ALT 16 12/09/2021   PROT 7.5 12/09/2021   CALCIUM 9.4 12/09/2021   December 09, 2021 ANA 1: 40NH, RF negative, anti-CCP negative, ESR 31 November 02, 2021 TB Gold negative.  Speciality Comments: No specialty comments available.  Procedures:  No procedures performed Allergies: Patient has no known allergies.   Assessment / Plan:     Visit Diagnoses: Bilateral hand pain - History of intermittent pain and swelling.  No synovitis was noted.  Hyperextension DIPs noted.  X-rays were unremarkable.  X-ray findings were discussed with the patient.  Patient states that recently she is not having as much discomfort in her hands.  I discussed the option of getting ultrasound of the bilateral hands in the future to evaluate for synovitis.  Patient stated that she will contact us when she develops swelling.  All autoimmune work-up negative including rheumatoid factor and anti-CCP antibody.  ANA was low titer positive which was not significant.  A handout on hand exercises was given.  Positive ANA-ANA is low titer positive and not significant.  She denies any history of oral ulcers, nasal ulcers, malar rash, photosensitivity, Raynaud's phenomenon or lymphadenopathy.  Elevated sed rate-sed rate is mildly elevated.  Left findings were discussed with the patient.  She had no synovitis on examination today.  Vies her to contact me if she develops any increased swelling.  Baker's cyst of knee, left - History of Baker's cyst in 2018 with no recurrence per patient.  No warmth swelling or effusion was noted.  Chronic pain of both knees - History of intermittent discomfort in her knee joints.  X-rays were unremarkable.  X-ray  findings were reviewed with the patient.  She is currently not having much discomfort.  No warmth swelling or effusion was noted.  Sed rate is mildly elevated.  A handout on lower extremity muscle strengthening exercises was given.  I advised her to contact me if she develops any swelling.  Use of natural anti-inflammatories and their side effects were also reviewed.  Chronic SI joint pain - History of discomfort in SI joints.  X-ray of the pelvis showed normal SI joints and hip joints.  X-ray findings were reviewed with the patient.  She had was on injections for pain management.  Chronic right-sided low back pain without sciatica - History of lower back pain.  X-rays were unremarkable in September 2023 which were read by radiologist.  She is going to physical therapy for core strengthening which is helpful.  Other medical problems are listed as follows:  Chronic idiopathic urticaria  Other fatigue  Hx of migraines  Smoker - half a pack per day for 15 years.  Smoking cessation was discussed.  Orders: No orders of the defined types were placed in this encounter.  No orders of the defined types were placed in this encounter.    Follow-Up Instructions: Return in about 6 months (around 07/01/2022) for Arthralgias, elevated ESR.   Bo Merino, MD  Note - This record has been created using Editor, commissioning.  Chart creation errors have been sought, but may not always  have been located. Such creation errors do not reflect on  the standard of medical care.

## 2021-12-30 ENCOUNTER — Ambulatory Visit: Payer: Medicaid Other | Attending: Rheumatology | Admitting: Rheumatology

## 2021-12-30 ENCOUNTER — Encounter: Payer: Self-pay | Admitting: Rheumatology

## 2021-12-30 VITALS — BP 132/84 | HR 84 | Resp 15 | Ht 67.0 in | Wt 202.0 lb

## 2021-12-30 DIAGNOSIS — M25562 Pain in left knee: Secondary | ICD-10-CM

## 2021-12-30 DIAGNOSIS — F17200 Nicotine dependence, unspecified, uncomplicated: Secondary | ICD-10-CM

## 2021-12-30 DIAGNOSIS — M79641 Pain in right hand: Secondary | ICD-10-CM

## 2021-12-30 DIAGNOSIS — M7122 Synovial cyst of popliteal space [Baker], left knee: Secondary | ICD-10-CM

## 2021-12-30 DIAGNOSIS — R5383 Other fatigue: Secondary | ICD-10-CM

## 2021-12-30 DIAGNOSIS — R700 Elevated erythrocyte sedimentation rate: Secondary | ICD-10-CM

## 2021-12-30 DIAGNOSIS — M533 Sacrococcygeal disorders, not elsewhere classified: Secondary | ICD-10-CM

## 2021-12-30 DIAGNOSIS — M25561 Pain in right knee: Secondary | ICD-10-CM

## 2021-12-30 DIAGNOSIS — G8929 Other chronic pain: Secondary | ICD-10-CM

## 2021-12-30 DIAGNOSIS — L501 Idiopathic urticaria: Secondary | ICD-10-CM

## 2021-12-30 DIAGNOSIS — Z8669 Personal history of other diseases of the nervous system and sense organs: Secondary | ICD-10-CM

## 2021-12-30 DIAGNOSIS — R768 Other specified abnormal immunological findings in serum: Secondary | ICD-10-CM

## 2021-12-30 DIAGNOSIS — M545 Low back pain, unspecified: Secondary | ICD-10-CM

## 2021-12-30 DIAGNOSIS — M79642 Pain in left hand: Secondary | ICD-10-CM

## 2021-12-30 NOTE — Patient Instructions (Signed)
Hand Exercises Hand exercises can be helpful for almost anyone. These exercises can strengthen the hands, improve flexibility and movement, and increase blood flow to the hands. These results can make work and daily tasks easier. Hand exercises can be especially helpful for people who have joint pain from arthritis or have nerve damage from overuse (carpal tunnel syndrome). These exercises can also help people who have injured a hand. Exercises Most of these hand exercises are gentle stretching and motion exercises. It is usually safe to do them often throughout the day. Warming up your hands before exercise may help to reduce stiffness. You can do this with gentle massage or by placing your hands in warm water for 10-15 minutes. It is normal to feel some stretching, pulling, tightness, or mild discomfort as you begin new exercises. This will gradually improve. Stop an exercise right away if you feel sudden, severe pain or your pain gets worse. Ask your health care provider which exercises are best for you. Knuckle bend or "claw" fist  Stand or sit with your arm, hand, and all five fingers pointed straight up. Make sure to keep your wrist straight during the exercise. Gently bend your fingers down toward your palm until the tips of your fingers are touching the top of your palm. Keep your big knuckle straight and just bend the small knuckles in your fingers. Hold this position for __________ seconds. Straighten (extend) your fingers back to the starting position. Repeat this exercise 5-10 times with each hand. Full finger fist  Stand or sit with your arm, hand, and all five fingers pointed straight up. Make sure to keep your wrist straight during the exercise. Gently bend your fingers into your palm until the tips of your fingers are touching the middle of your palm. Hold this position for __________ seconds. Extend your fingers back to the starting position, stretching every joint fully. Repeat  this exercise 5-10 times with each hand. Straight fist Stand or sit with your arm, hand, and all five fingers pointed straight up. Make sure to keep your wrist straight during the exercise. Gently bend your fingers at the big knuckle, where your fingers meet your hand, and the middle knuckle. Keep the knuckle at the tips of your fingers straight and try to touch the bottom of your palm. Hold this position for __________ seconds. Extend your fingers back to the starting position, stretching every joint fully. Repeat this exercise 5-10 times with each hand. Tabletop  Stand or sit with your arm, hand, and all five fingers pointed straight up. Make sure to keep your wrist straight during the exercise. Gently bend your fingers at the big knuckle, where your fingers meet your hand, as far down as you can while keeping the small knuckles in your fingers straight. Think of forming a tabletop with your fingers. Hold this position for __________ seconds. Extend your fingers back to the starting position, stretching every joint fully. Repeat this exercise 5-10 times with each hand. Finger spread  Place your hand flat on a table with your palm facing down. Make sure your wrist stays straight as you do this exercise. Spread your fingers and thumb apart from each other as far as you can until you feel a gentle stretch. Hold this position for __________ seconds. Bring your fingers and thumb tight together again. Hold this position for __________ seconds. Repeat this exercise 5-10 times with each hand. Making circles  Stand or sit with your arm, hand, and all five fingers pointed   straight up. Make sure to keep your wrist straight during the exercise. Make a circle by touching the tip of your thumb to the tip of your index finger. Hold for __________ seconds. Then open your hand wide. Repeat this motion with your thumb and each finger on your hand. Repeat this exercise 5-10 times with each hand. Thumb  motion  Sit with your forearm resting on a table and your wrist straight. Your thumb should be facing up toward the ceiling. Keep your fingers relaxed as you move your thumb. Lift your thumb up as high as you can toward the ceiling. Hold for __________ seconds. Bend your thumb across your palm as far as you can, reaching the tip of your thumb for the small finger (pinkie) side of your palm. Hold for __________ seconds. Repeat this exercise 5-10 times with each hand. Grip strengthening  Hold a stress ball or other soft ball in the middle of your hand. Slowly increase the pressure, squeezing the ball as much as you can without causing pain. Think of bringing the tips of your fingers into the middle of your palm. All of your finger joints should bend when doing this exercise. Hold your squeeze for __________ seconds, then relax. Repeat this exercise 5-10 times with each hand. Contact a health care provider if: Your hand pain or discomfort gets much worse when you do an exercise. Your hand pain or discomfort does not improve within 2 hours after you exercise. If you have any of these problems, stop doing these exercises right away. Do not do them again unless your health care provider says that you can. Get help right away if: You develop sudden, severe hand pain or swelling. If this happens, stop doing these exercises right away. Do not do them again unless your health care provider says that you can. This information is not intended to replace advice given to you by your health care provider. Make sure you discuss any questions you have with your health care provider. Document Revised: 04/29/2020 Document Reviewed: 04/29/2020 [employer] Patient Education  2023 [employer] Exercises for Chronic Knee Pain Chronic knee pain is pain that lasts longer than 3 months. For most people with chronic knee pain, exercise and weight loss is an important part of treatment. Your health care provider may want  you to focus on: Strengthening the muscles that support your knee. This can take pressure off your knee and lessen pain. Preventing knee stiffness. Maintaining or increasing how far you can move your knee. Losing weight (if this applies) to take pressure off your knee, decrease your risk for injury, and make it easier for you to exercise. Your health care provider will help you develop an exercise program that matches your needs and physical abilities. Below are simple, low-impact exercises you can do at home. Ask your health care provider or a physical therapist how often you should do your exercise program and how many times to repeat each exercise. General safety tips Follow these safety tips for exercising with chronic knee pain: Get your health care provider's approval before doing any exercises. Start slowly and stop any time an exercise causes pain. Do not exercise if your knee pain is flaring up. Warm up first. Stretching a cold muscle can cause an injury. Do 5-10 minutes of easy movement or light stretching before beginning your exercise routine. Do 5-10 minutes of low-impact activity (like walking or cycling) before starting strengthening exercises. Contact your health care provider any time you have pain   pain during or after exercising. Exercise may cause discomfort but should not be painful. It is normal to be a little stiff or sore after exercising.  Stretching and range-of-motion exercises Front thigh stretch  Stand up straight and support your body by holding on to a chair or resting one hand on a wall. With your legs straight and close together, bend one knee to lift your heel up toward your buttocks. Using one hand for support, grab your ankle with your free hand. Pull your foot up closer toward your buttocks to feel the stretch in front of your thigh. Hold the stretch for 30 seconds. Repeat __________ times. Complete this exercise __________ times a day. Back thigh stretch  Sit  on the floor with your back straight and your legs out straight in front of you. Place the palms of your hands on the floor and slide them toward your feet as you bend at the hip. Try to touch your nose to your knees and feel the stretch in the back of your thighs. Hold for 30 seconds. Repeat __________ times. Complete this exercise __________ times a day. Calf stretch  Stand facing a wall. Place the palms of your hands flat against the wall, arms extended, and lean slightly against the wall. Get into a lunge position with one leg bent at the knee and the other leg stretched out straight behind you. Keep both feet facing the wall and increase the bend in your knee while keeping the heel of the other leg flat on the ground. You should feel the stretch in your calf. Hold for 30 seconds. Repeat __________ times. Complete this exercise __________ times a day. Strengthening exercises Straight leg lift Lie on your back with one knee bent and the other leg out straight. Slowly lift the straight leg without bending the knee. Lift until your foot is about 12 inches (30 cm) off the floor. Hold for 3-5 seconds and slowly lower your leg. Repeat __________ times. Complete this exercise __________ times a day. Single leg dip Stand between two chairs and put both hands on the backs of the chairs for support. Extend one leg out straight with your body weight resting on the heel of the standing leg. Slowly bend your standing knee to dip your body to the level that is comfortable for you. Hold for 3-5 seconds. Repeat __________ times. Complete this exercise __________ times a day. Hamstring curls Stand straight, knees close together, facing the back of a chair. Hold on to the back of a chair with both hands. Keep one leg straight. Bend the other knee while bringing the heel up toward the buttock until the knee is bent at a 90-degree angle (right angle). Hold for 3-5 seconds. Repeat __________ times.  Complete this exercise __________ times a day. Wall squat Stand straight with your back, hips, and head against a wall. Step forward one foot at a time with your back still against the wall. Your feet should be 2 feet (61 cm) from the wall at shoulder width. Keeping your back, hips, and head against the wall, slide down the wall to as close of a sitting position as you can get. Hold for 5-10 seconds, then slowly slide back up. Repeat __________ times. Complete this exercise __________ times a day. Step-ups Step up with one foot onto a sturdy platform or stool that is about 6 inches (15 cm) high. Face sideways with one foot on the platform and one on the ground. Place all your weight  on the platform foot and lift your body off the ground until your knee extends. Let your other leg hang free to the side. Hold for 3-5 seconds then slowly lower your weight down to the floor foot. Repeat __________ times. Complete this exercise __________ times a day. Contact a health care provider if: Your exercise causes pain. Your pain is worse after you exercise. Your pain prevents you from doing your exercises. This information is not intended to replace advice given to you by your health care provider. Make sure you discuss any questions you have with your health care provider. Document Revised: 05/15/2019 Document Reviewed: 01/06/2019 Else[employer] Patient Education  [place].

## 2022-01-23 ENCOUNTER — Telehealth: Payer: Self-pay

## 2022-01-23 ENCOUNTER — Ambulatory Visit
Admission: EM | Admit: 2022-01-23 | Discharge: 2022-01-23 | Disposition: A | Discharge status: Home / Self Care | Payer: Medicaid Other | Attending: Urgent Care | Admitting: Urgent Care

## 2022-01-23 DIAGNOSIS — F17200 Nicotine dependence, unspecified, uncomplicated: Secondary | ICD-10-CM | POA: Diagnosis not present

## 2022-01-23 DIAGNOSIS — Z1152 Encounter for screening for COVID-19: Secondary | ICD-10-CM | POA: Insufficient documentation

## 2022-01-23 DIAGNOSIS — F17210 Nicotine dependence, cigarettes, uncomplicated: Secondary | ICD-10-CM | POA: Diagnosis not present

## 2022-01-23 DIAGNOSIS — B349 Viral infection, unspecified: Secondary | ICD-10-CM | POA: Diagnosis not present

## 2022-01-23 DIAGNOSIS — Z791 Long term (current) use of non-steroidal anti-inflammatories (NSAID): Secondary | ICD-10-CM | POA: Insufficient documentation

## 2022-01-23 DIAGNOSIS — D573 Sickle-cell trait: Secondary | ICD-10-CM | POA: Insufficient documentation

## 2022-01-23 MED ORDER — ALBUTEROL SULFATE HFA 108 (90 BASE) MCG/ACT IN AERS: 1.0000 | INHALATION_SPRAY | Freq: Four times a day (QID) | RESPIRATORY_TRACT | 0 refills | Status: DC | PRN

## 2022-01-23 MED ORDER — PAXLOVID (300/100) 20 X 150 MG & 10 X 100MG PO TBPK: ORAL_TABLET | ORAL | 0 refills | Status: DC

## 2022-01-23 MED ORDER — PROMETHAZINE-DM 6.25-15 MG/5ML PO SYRP: 5.0000 mL | ORAL_SOLUTION | Freq: Three times a day (TID) | ORAL | 0 refills | Status: DC | PRN

## 2022-01-23 MED ORDER — PREDNISONE 20 MG PO TABS: ORAL_TABLET | ORAL | 0 refills | Status: DC

## 2022-01-23 NOTE — ED Provider Notes (Addendum)
[facility]  Note:  This document was prepared using Systems analyst and may include unintentional dictation errors.  MRN: [id number] DOB: 1985-06-08  Subjective:   Wendy Weaver is a 37 y.o. female presenting for 3-day history of acute onset malaise, body aches, chills, coughing, sinus congestion, sinus drainage, chest congestion.  Patient is a smoker, smokes 1/2ppd.  Is currently awaiting an evaluation by pulmonologist.  No history of asthma.  Would like a COVID test.  No history of CKD.  No current facility-administered medications for this encounter.  Current Outpatient Medications:    Acetaminophen (TYLENOL PO), Take by mouth every other day., Disp: , Rfl:    IBUPROFEN PO, Take by mouth every other day., Disp: , Rfl:    meloxicam (MOBIC) 15 MG tablet, Take 1 tablet by mouth daily., Disp: 30 tablet, Rfl: 1   No Known Allergies  Past Medical History:  Diagnosis Date   Back pain    Hip pain    Sickle cell trait ([place])      Past Surgical History:  Procedure Laterality Date   arm surgery Right    right wrist    Family History  Problem Relation Age of Onset   Diabetes Mother    Hypertension Mother    Arthritis Mother    Hypertension Father    Hypertension Sister     Social History   Tobacco Use   Smoking status: Every Day    Packs/day: 0.50    Years: 17.00    Total pack years: 8.50    Types: Cigarettes    Passive exposure: Never   Smokeless tobacco: Never  Vaping Use   Vaping Use: Never used  Substance Use Topics   Alcohol use: Yes    Comment: occ   Drug use: No    ROS   Objective:   Vitals: BP 139/89 (BP Location: Right Arm)   Pulse 97   Temp 98.9 F (37.2 C) (Oral)   Resp 16   LMP 01/13/2022 (Exact Date)   SpO2 97%   Physical Exam Constitutional:      General: She is not in acute distress.    Appearance: Normal appearance. She is well-developed and normal weight. She is not ill-appearing,  toxic-appearing or diaphoretic.  HENT:     Head: Normocephalic and atraumatic.     Right Ear: Tympanic membrane, ear canal and external ear normal. No drainage or tenderness. No middle ear effusion. There is no impacted cerumen. Tympanic membrane is not erythematous or bulging.     Left Ear: Tympanic membrane, ear canal and external ear normal. No drainage or tenderness.  No middle ear effusion. There is no impacted cerumen. Tympanic membrane is not erythematous or bulging.     Nose: Congestion present. No rhinorrhea.     Mouth/Throat:     Mouth: Mucous membranes are moist. No oral lesions.     Pharynx: No pharyngeal swelling, oropharyngeal exudate, posterior oropharyngeal erythema or uvula swelling.     Tonsils: No tonsillar exudate or tonsillar abscesses.     Comments: Thick streaks of postnasal drainage overlying pharynx. Eyes:     General: No scleral icterus.       Right eye: No discharge.        Left eye: No discharge.     Extraocular Movements: Extraocular movements intact.     Right eye: Normal extraocular motion.     Left eye: Normal extraocular motion.     Conjunctiva/sclera: Conjunctivae normal.  Cardiovascular:  Rate and Rhythm: Normal rate and regular rhythm.     Heart sounds: Normal heart sounds. No murmur heard.    No friction rub. No gallop.  Pulmonary:     Effort: Pulmonary effort is normal. No respiratory distress.     Breath sounds: No stridor. No wheezing, rhonchi or rales.     Comments: Slight decrease in lung sounds throughout. Chest:     Chest wall: No tenderness.  Musculoskeletal:     Cervical back: Normal range of motion and neck supple.  Lymphadenopathy:     Cervical: No cervical adenopathy.  Skin:    General: Skin is warm and dry.  Neurological:     General: No focal deficit present.     Mental Status: She is alert and oriented to person, place, and time.  Psychiatric:        Mood and Affect: Mood normal.        Behavior: Behavior normal.      Assessment and Plan :   PDMP not reviewed this encounter.  1. Acute viral syndrome   2. Smoker     Patient would like to start Paxlovid empirically and if her test result is positive we will continue it.  Otherwise if its negative she will stop it.  I am agreeable to doing this given the timeline of her illness and with the holiday I do not want to miss the opportunity to start Paxlovid as her results will be delayed until she is beyond the window started.  Most recent GFR from November was greater than 60 mL/min.  Deferred imaging given clear cardiopulmonary exam, hemodynamically stable vital signs.  In the context of her respiratory symptoms, smoking recommended albuterol and prednisone.  Use supportive care otherwise for an acute viral syndrome. Counseled patient on potential for adverse effects with medications prescribed/recommended today, ER and return-to-clinic precautions discussed, patient verbalized understanding.     Jaynee Eagles, PA-C 01/23/22 1158

## 2022-01-23 NOTE — Discharge Instructions (Addendum)
We will notify you of your test results as they arrive and may take between about 24 hours.  I encourage you to sign up for MyChart if you have not already done so as this can be the easiest way for [place] to communicate results to you online or through a phone app.  Generally, we only contact you if it is a positive test result.  In the meantime, if you develop worsening symptoms including fever, chest pain, shortness of breath despite our current treatment plan then please report to the emergency room as this may be a sign of worsening status from possible viral infection.  Otherwise, we will manage this as a viral syndrome. For sore throat or cough try using a honey-based tea. Use 3 teaspoons of honey with juice squeezed from half lemon. Place shaved pieces of ginger into 1/2-1 cup of water and warm over stove top. Then mix the ingredients and repeat every 4 hours as needed. Please take Tylenol '500mg'$ -'650mg'$  every 6 hours for aches and pains, fevers. Hydrate very well with at least 2 liters of water. Eat light meals such as soups to replenish electrolytes and soft fruits, veggies. Start an antihistamine like Zyrtec for postnasal drainage, sinus congestion.  You can take this together with prednisone and albuterol.  Use the cough medications as needed.

## 2022-01-23 NOTE — ED Triage Notes (Signed)
Pt states cough,congestion,body aches and chills for the past 3 days.  States when she coughs her chest and back hurt.

## 2022-01-24 LAB — SARS CORONAVIRUS 2 (TAT 6-24 HRS): SARS Coronavirus 2: NEGATIVE

## 2022-06-22 NOTE — Progress Notes (Signed)
Office Visit Note  Patient: Wendy Weaver             Date of Birth: Aug 10, 1985           MRN: [id number]             PCP: Kerin Salen, PA-C Referring: Kathaleen Bury* Visit Date: 07/04/2022 Occupation: @GUAROCC @  Subjective:  Pain in multiple joints  History of Present Illness: Wendy Weaver is a 37 y.o. female with history of polyarthralgia.  She states that she had SI joint injections in April 2024 at the pain management at [facility].  She states the initial injections were helpful but the repeat injections did not last as long.  Continues to have back pain and SI joint pain.  She also has discomfort in her hands and her knee joints.  She feels a stiffness in her hands but not or joint swelling.  She continues to have morning stiffness lasting for 10 to 15 minutes.  She describes nocturnal pain in her hips which she describes in the trochanteric region.  She has been going to the gym about 3 times a week.  She states she does cardio and weights.    Activities of Daily Living:  Patient reports morning stiffness for 10-15 minutes.   Patient Reports nocturnal pain.  Difficulty dressing/grooming: Reports Difficulty climbing stairs: Reports Difficulty getting out of chair: Reports Difficulty using hands for taps, buttons, cutlery, and/or writing: Reports  Review of Systems  Constitutional:  Positive for fatigue.  HENT:  Negative for mouth sores and mouth dryness.   Eyes:  Negative for dryness.  Respiratory:  Negative for shortness of breath.   Cardiovascular:  Negative for chest pain and palpitations.  Gastrointestinal:  Negative for blood in stool, constipation and diarrhea.  Endocrine: Negative for increased urination.  Genitourinary:  Negative for involuntary urination.  Musculoskeletal:  Positive for joint pain, joint pain and morning stiffness. Negative for gait problem, joint swelling, myalgias, muscle weakness, muscle tenderness and myalgias.   Skin:  Positive for sensitivity to sunlight. Negative for color change and rash.  Allergic/Immunologic: Negative for susceptible to infections.  Neurological:  Negative for dizziness and headaches.  Hematological:  Negative for swollen glands.  Psychiatric/Behavioral:  Positive for sleep disturbance. Negative for depressed mood. The patient is not nervous/anxious.     PMFS History:  Patient Active Problem List   Diagnosis Date Noted   Hx of migraines 12/09/2021   Chronic idiopathic urticaria 12/09/2021    Past Medical History:  Diagnosis Date   Back pain    Hip pain    Sickle cell trait ([facility])     Family History  Problem Relation Age of Onset   Diabetes Mother    Hypertension Mother    Arthritis Mother    Hypertension Father    Hypertension Sister    Past Surgical History:  Procedure Laterality Date   arm surgery Right    right wrist   Social History   Social History Narrative   Not on file   Immunization History  Administered Date(s) Administered   PFIZER(Purple Top)SARS-COV-2 Vaccination 05/09/2019, 06/02/2019   Tdap 09/04/2021     Objective: Vital Signs: BP 112/73 (BP Location: Left Arm, Patient Position: Sitting, Cuff Size: Normal)   Pulse 83   Resp 16   Ht 5\' 7"  (1.702 m)   Wt 197 lb 9.6 oz (89.6 kg)   BMI 30.95 kg/m    Physical Exam Vitals and nursing note reviewed.  Constitutional:  Appearance: She is well-developed.  HENT:     Head: Normocephalic and atraumatic.  Eyes:     Conjunctiva/sclera: Conjunctivae normal.  Cardiovascular:     Rate and Rhythm: Normal rate and regular rhythm.     Heart sounds: Normal heart sounds.  Pulmonary:     Effort: Pulmonary effort is normal.     Breath sounds: Normal breath sounds.  Abdominal:     General: Bowel sounds are normal.     Palpations: Abdomen is soft.  Musculoskeletal:     Cervical back: Normal range of motion.  Lymphadenopathy:     Cervical: No cervical adenopathy.  Skin:    General: Skin  is warm and dry.     Capillary Refill: Capillary refill takes less than 2 seconds.  Neurological:     Mental Status: She is alert and oriented to person, place, and time.  Psychiatric:        Behavior: Behavior normal.      Musculoskeletal Exam: Cervical spine was in good range of motion.  She had good range of motion of thoracic and lumbar spine.  She had tenderness over lower lumbar region and also SI joints.  Shoulder joints, elbow joints, wrist joints, MCPs PIPs and DIPs were in good range of motion without any synovitis.  Hip joints and knee joints were in good range of motion without any warmth swelling or effusion.  She had no tenderness over ankles or MTPs.  CDAI Exam: CDAI Score: -- Patient Global: --; Provider Global: -- Swollen: --; Tender: -- Joint Exam 07/04/2022   No joint exam has been documented for this visit   There is currently no information documented on the homunculus. Go to the Rheumatology activity and complete the homunculus joint exam.  Investigation: No additional findings.  Imaging: No results found.  Recent Labs: Lab Results  Component Value Date   WBC 10.7 12/09/2021   HGB 12.1 12/09/2021   PLT 376 12/09/2021   NA 140 12/09/2021   K 4.2 12/09/2021   CL 106 12/09/2021   CO2 26 12/09/2021   GLUCOSE 94 12/09/2021   BUN 8 12/09/2021   CREATININE 0.68 12/09/2021   BILITOT 0.4 12/09/2021   AST 22 12/09/2021   ALT 16 12/09/2021   PROT 7.5 12/09/2021   CALCIUM 9.4 12/09/2021    Speciality Comments: No specialty comments available.  Procedures:  No procedures performed Allergies: Patient has no known allergies.   Assessment / Plan:     Visit Diagnoses: Bilateral hand pain - History of intermittent pain and swelling.  No synovitis was noted.  Hyperextension DIPs noted.  X-rays were unremarkable.  X-ray findings were reviewed with the patient.  Positive ANA (antinuclear antibody) - ANA is low titer positive and not significant.  Patient  denies any history of oral ulcers, nasal ulcers, malar rash, photosensitivity, Raynaud's or lymphadenopathy.  Patient was advised to contact us if she develops any new symptoms.  Elevated sed rate - sed rate is mildly elevated.  She had no synovitis on the examination today.  Baker's cyst of knee, left - History of Baker's cyst in 2018 with no recurrence per patient.  Chronic pain of both knees - History of intermittent discomfort in her knee joints.  X-rays obtained at the last visit are within normal limits.  X-ray findings from December 09, 2021 were reviewed with the patient.  Lower extremity muscle strengthening exercises were discussed.  A handout was provided.  Chronic SI joint pain - History of discomfort in SI  joints.  X-rays from December 09, 2021 of the pelvis showed normal SI joints and hip joints. -She recently had injections in her SI joints.  She had no response to the cortisone injection.  Will refer to water therapy.  Plan: Ambulatory referral to Physical Therapy  Chronic right-sided low back pain without sciatica - X-rays were unremarkable in September 2023 which were read by radiologist.  She is going to physical therapy for core strengthening which is helpful. - Plan: Ambulatory referral to Physical Therapy (water therapy)  Chronic idiopathic urticaria-no recent flares.  Hx of migraines  Other fatigue  Smoker - half a pack per day for 15 years.  Orders: Orders Placed This Encounter  Procedures   Ambulatory referral to Physical Therapy   No orders of the defined types were placed in this encounter.    Follow-Up Instructions: Return in about 1 year (around 07/04/2023) for Osteoarthritis.   Pollyann Savoy, MD  Note - This record has been created using Animal nutritionist.  Chart creation errors have been sought, but may not always  have been located. Such creation errors do not reflect on  the standard of medical care.

## 2022-07-04 ENCOUNTER — Ambulatory Visit: Payer: BC Managed Care – PPO | Attending: Rheumatology | Admitting: Rheumatology

## 2022-07-04 ENCOUNTER — Encounter: Payer: Self-pay | Admitting: Rheumatology

## 2022-07-04 VITALS — BP 112/73 | HR 83 | Resp 16 | Ht 67.0 in | Wt 197.6 lb

## 2022-07-04 DIAGNOSIS — R5383 Other fatigue: Secondary | ICD-10-CM

## 2022-07-04 DIAGNOSIS — M25561 Pain in right knee: Secondary | ICD-10-CM

## 2022-07-04 DIAGNOSIS — G8929 Other chronic pain: Secondary | ICD-10-CM

## 2022-07-04 DIAGNOSIS — M533 Sacrococcygeal disorders, not elsewhere classified: Secondary | ICD-10-CM

## 2022-07-04 DIAGNOSIS — M545 Low back pain, unspecified: Secondary | ICD-10-CM

## 2022-07-04 DIAGNOSIS — M7122 Synovial cyst of popliteal space [Baker], left knee: Secondary | ICD-10-CM | POA: Diagnosis not present

## 2022-07-04 DIAGNOSIS — Z8669 Personal history of other diseases of the nervous system and sense organs: Secondary | ICD-10-CM

## 2022-07-04 DIAGNOSIS — F17200 Nicotine dependence, unspecified, uncomplicated: Secondary | ICD-10-CM

## 2022-07-04 DIAGNOSIS — M79641 Pain in right hand: Secondary | ICD-10-CM | POA: Diagnosis not present

## 2022-07-04 DIAGNOSIS — R768 Other specified abnormal immunological findings in serum: Secondary | ICD-10-CM | POA: Diagnosis not present

## 2022-07-04 DIAGNOSIS — M79642 Pain in left hand: Secondary | ICD-10-CM

## 2022-07-04 DIAGNOSIS — R700 Elevated erythrocyte sedimentation rate: Secondary | ICD-10-CM

## 2022-07-04 DIAGNOSIS — L501 Idiopathic urticaria: Secondary | ICD-10-CM

## 2022-07-04 DIAGNOSIS — M25562 Pain in left knee: Secondary | ICD-10-CM

## 2022-07-04 NOTE — Patient Instructions (Signed)
Exercises for Chronic Knee Pain Chronic knee pain is pain that lasts longer than 3 months. For most people with chronic knee pain, exercise and weight loss is an important part of treatment. Your health care provider may want you to focus on: Making the muscles that support your knee stronger. This can take pressure off your knee and reduce pain. Preventing knee stiffness. How far you can move your knee, keeping it there or making it farther. Losing weight (if this applies) to take pressure off your knee, lower your risk for injury, and make it easier for you to exercise. Your provider will help you make an exercise program that fits your needs and physical abilities. Below are simple, low-impact exercises you can do at home. Ask your provider or physical therapist how often you should do your exercise program and how many times to repeat each exercise. General safety tips  Get your provider's approval before doing any exercises. Start slowly and stop any time you feel pain. Do not exercise if your knee pain is flaring up. Warm up first. Stretching a cold muscle can cause an injury. Do 5-10 minutes of easy movement or light stretching before beginning your exercises. Do 5-10 minutes of low-impact activity (like walking or cycling) before starting strengthening exercises. Contact your provider any time you have pain during or after exercising. Exercise can cause discomfort but should not be painful. It is normal to be a little stiff or sore after exercising. Stretching and range-of-motion exercises Front thigh stretch  Stand up straight and support your body by holding on to a chair or resting one hand on a wall. With your legs straight and close together, bend one knee to lift your heel up toward your butt. Using one hand for support, grab your ankle with your free hand. Pull your foot up closer toward your butt to feel the stretch in front of your thigh. Hold the stretch for 30  seconds. Repeat __________ times. Complete this exercise __________ times a day. Back thigh stretch  Sit on the floor with your back straight and your legs out straight in front of you. Place the palms of your hands on the floor and slide them toward your feet as you bend at the hip. Try to touch your nose to your knees and feel the stretch in the back of your thighs. Hold for 30 seconds. Repeat __________ times. Complete this exercise __________ times a day. Calf stretch  Stand facing a wall. Place the palms of your hands flat against the wall, arms extended, and lean slightly against the wall. Get into a lunge position with one leg bent at the knee and the other leg stretched out straight behind you. Keep both feet facing the wall and increase the bend in your knee while keeping the heel of the other leg flat on the ground. You should feel the stretch in your calf. Hold for 30 seconds. Repeat __________ times. Complete this exercise __________ times a day. Strengthening exercises Straight leg lift  Lie on your back with one knee bent and the other leg out straight. Slowly lift the straight leg without bending the knee. Lift until your foot is about 12 inches (30 cm) off the floor. Hold for 3-5 seconds and slowly lower your leg. Repeat __________ times. Complete this exercise __________ times a day. Single leg dip  Stand between two chairs and put both hands on the backs of the chairs for support. Extend one leg out straight with your body   weight resting on the heel of the standing leg. Slowly bend your standing knee to dip your body to the level that is comfortable for you. Hold for 3-5 seconds. Repeat __________ times. Complete this exercise __________ times a day. Hamstring curls  Stand straight, knees close together, facing the back of a chair. Hold on to the back of a chair with both hands. Keep one leg straight. Bend the other knee while bringing the heel up toward the butt  until the knee is bent at a 90-degree angle (right angle). Hold for 3-5 seconds. Repeat __________ times. Complete this exercise __________ times a day. Wall squat  Stand straight with your back, hips, and head against a wall. Step forward one foot at a time with your back still against the wall. Your feet should be 2 feet (61 cm) from the wall at shoulder width. Keeping your back, hips, and head against the wall, slide down the wall to as close to a sitting position as you can get. Hold for 5-10 seconds, then slowly slide back up. Repeat __________ times. Complete this exercise __________ times a day. Step-ups  Stand in front of a sturdy platform or stool that is about 6 inches (15 cm) high. Slowly step up with your left / right foot, keeping your knee in line with your hip and foot. Do not let your knee bend so far that you cannot see your toes. Hold on to a chair for balance, but do not use it for support. Slowly unlock your knee and lower yourself to the starting position. Repeat __________ times. Complete this exercise __________ times a day. Contact a health care provider if: Your exercises cause pain. Your pain is worse after you exercise. Your pain prevents you from doing your exercises. This information is not intended to replace advice given to you by your health care provider. Make sure you discuss any questions you have with your health care provider. Document Revised: 01/24/2022 Document Reviewed: 01/24/2022 [employer]vier Patient Education  2024 [employer]  

## 2022-07-10 ENCOUNTER — Encounter: Payer: Self-pay | Admitting: Physical Therapy

## 2022-07-10 ENCOUNTER — Ambulatory Visit: Payer: BC Managed Care – PPO | Attending: Rheumatology | Admitting: Physical Therapy

## 2022-07-10 ENCOUNTER — Other Ambulatory Visit: Payer: Self-pay

## 2022-07-10 DIAGNOSIS — M533 Sacrococcygeal disorders, not elsewhere classified: Secondary | ICD-10-CM | POA: Insufficient documentation

## 2022-07-10 DIAGNOSIS — G8929 Other chronic pain: Secondary | ICD-10-CM | POA: Insufficient documentation

## 2022-07-10 DIAGNOSIS — M545 Low back pain, unspecified: Secondary | ICD-10-CM | POA: Insufficient documentation

## 2022-07-10 DIAGNOSIS — M6281 Muscle weakness (generalized): Secondary | ICD-10-CM | POA: Insufficient documentation

## 2022-07-10 DIAGNOSIS — M5459 Other low back pain: Secondary | ICD-10-CM | POA: Diagnosis present

## 2022-07-10 NOTE — Therapy (Signed)
OUTPATIENT PHYSICAL THERAPY THORACOLUMBAR EVALUATION   Patient Name: Wendy Weaver MRN: [id number] DOB:[birth year], 37 y.o., female Today's Date: 07/10/2022  END OF SESSION:  PT End of Session - 07/10/22 1349     Visit Number 1    Date for PT Re-Evaluation 09/04/22    Authorization Type [employer] Medicaid - no auth needed over 21    Authorization - Number of Visits 27    PT Start Time 1400    PT Stop Time 1440    PT Time Calculation (min) 40 min    Activity Tolerance Patient tolerated treatment well    Behavior During Therapy WFL for tasks assessed/performed             Past Medical History:  Diagnosis Date   Back pain    Hip pain    Sickle cell trait (HCC)    Past Surgical History:  Procedure Laterality Date   arm surgery Right    right wrist   Patient Active Problem List   Diagnosis Date Noted   Hx of migraines 12/09/2021   Chronic idiopathic urticaria 12/09/2021    PCP: Kerin Salen, PA-C  REFERRING PROVIDER: Pollyann Savoy, MD  REFERRING DIAG: M53.3,G89.29 (ICD-10-CM) - Chronic SI joint pain M54.50,G89.29 (ICD-10-CM) - Chronic right-sided low back pain without sciatica  Rationale for Evaluation and Treatment: Rehabilitation  THERAPY DIAG:  Other low back pain  Muscle weakness (generalized)  ONSET DATE: 2014 after having twins - has had 3 vaginal deliveries  SUBJECTIVE:                                                                                                                                                                                           SUBJECTIVE STATEMENT: Pain started in 2014 after having twins.  Used to have Rt LE shooting pain and weakness and leg would give out.  No more LE pain but weakness continues.  Have been going to gym 3x/week for past 8 mos - walks on TM and weight machines.  Rt low back is worse than Lt - feels like my hips.  Pain is worse with sitting and sleeping on either side.  I have had to leave several  jobs b/c of pain.    PERTINENT HISTORY:  + ANA Polyarthralgia Bil knee and hand pain Hx of injections into SI joints - first round helped, second round didn't Goes to gym 3x/week - weights and cardio  PAIN:  PAIN:  Are you having pain? Yes NPRS scale: 5/10 today - pain range varies Pain location: Rt > Lt SI joints Pain orientation: Bilateral  PAIN TYPE: aching, dull, and sharp Pain description:  constant  Aggravating factors: sitting and trying to sleep, bending Relieving factors: heat   PRECAUTIONS: None  WEIGHT BEARING RESTRICTIONS: No  FALLS:  Has patient fallen in last 6 months? No  LIVING ENVIRONMENT: Lives with: lives with their family Lives in: House/apartment Stairs: Yes: Internal: 12 steps; can reach both and External: 6 steps; can reach both Has following equipment at home: None  OCCUPATION: starting new job tomorrow - doing pre-certifications - will need to be able to sit  PLOF: Independent  PATIENT GOALS: get some relief  NEXT MD VISIT: 3 mos  OBJECTIVE:   DIAGNOSTIC FINDINGS:  Pelvic xray: Impression: Unremarkable x-rays of the SI joints and hip joints  PATIENT SURVEYS:  Modified Oswestry 18/50   SCREENING FOR RED FLAGS: Bowel or bladder incontinence: No Spinal tumors: No Cauda equina syndrome: No Compression fracture: No Abdominal aneurysm: No  COGNITION: Overall cognitive status: Within functional limits for tasks assessed     SENSATION: WFL  MUSCLE LENGTH: Hamstrings: Right 70 deg; Left 70 deg End range piriformis and gluteals limited bil  POSTURE: forward head and increased lumbar lordosis  PALPATION: Tender along bil SI joints Pain with lumbar PA throughout lumbar spine  LUMBAR ROM:   AROM eval  Flexion Full with pain  Extension 15 deg with pain  Right lateral flexion Full with pain  Left lateral flexion Full with pain  Right rotation 50%  Left rotation 50%   (Blank rows = not tested)  LOWER EXTREMITY ROM:    WNL  bil  LOWER EXTREMITY MMT:   4/5 bil hips and knees  Umbilical diastasis 1.5 finger width - abdominals 3+/5   LUMBAR SPECIAL TESTS:  SI Compression/distraction test: Positive   GAIT: Distance walked: within clinic Assistive device utilized: None Level of assistance: Complete Independence Comments: slight out-toeing  TODAY'S TREATMENT:                                                                                                                              DATE:  07/10/22  Initiated HEP  Check all possible CPT codes: 29562- Therapeutic Exercise    Check all conditions that are expected to impact treatment: {Conditions expected to impact treatment:None of these apply   If treatment provided at initial evaluation, no treatment charged due to lack of authorization.        PATIENT EDUCATION:  Education details: [id number] Person educated: Patient Education method: Explanation, Demonstration, Verbal cues, and Handouts Education comprehension: verbalized understanding and returned demonstration  HOME EXERCISE PROGRAM: Access Code: [email or web address] URL: [email or web address] Date: 07/10/2022 Prepared by: Loistine Simas Loraina Stauffer  Exercises - Supine Posterior Pelvic Tilt  - 1 x daily - 7 x weekly - 2 sets - 10 reps - Sidelying Transversus Abdominis Bracing  - 1 x daily - 7 x weekly - 2 sets - 10 reps - 5 hold - Clamshell  - 1 x daily - 7 x weekly - 2 sets - 10 reps - Supine Bridge  -  1 x daily - 7 x weekly - 2 sets - 10 reps - Supine Piriformis Stretch with Foot on Ground  - 1 x daily - 7 x weekly - 1 sets - 2 reps - 20-30 hold  ASSESSMENT:  CLINICAL IMPRESSION: Patient is a 37 y.o. female who was seen today for physical therapy evaluation and treatment for chronic bil SI joint pain and LBP Rt>Lt.  Pt managed by rheumatolgy with polyarthralgia.  She has had pain since 2014 after the birth of her twins.  She has limited and painful trunk ROM with pain on palpation along bil SI  joints and central lumbar spine.  She has weakness in deep core with + umbilical rectus diastasis 1.5 finger width.  Weakness is also present in hip abd, add, rotators and extensors.  Pt has pain with sitting and attempts to sleep on sides.  Pain is constant but varies in intensity.  She starts a new job requiring sitting tomorrow.  HEP initiated today and Pt educated on role of deep abdominal stabilizers.  Pt will benefit from skilled PT to address findings, reduce pain and improve tolerance of daily demands.  OBJECTIVE IMPAIRMENTS: Abnormal gait, decreased coordination, decreased endurance, decreased mobility, difficulty walking, decreased ROM, decreased strength, hypomobility, impaired flexibility, impaired tone, improper body mechanics, and pain.   ACTIVITY LIMITATIONS: bending, sitting, squatting, sleeping, stairs, transfers, bed mobility, and locomotion level  PARTICIPATION LIMITATIONS: cleaning, laundry, driving, shopping, community activity, and yard work  PERSONAL FACTORS: Time since onset of injury/illness/exacerbation are also affecting patient's functional outcome.   REHAB POTENTIAL: Excellent  CLINICAL DECISION MAKING: Stable/uncomplicated  EVALUATION COMPLEXITY: Low   GOALS: Goals reviewed with patient? Yes  SHORT TERM GOALS: Target date: 08/07/22  Pt will be ind with initial HEP Baseline: Goal status: INITIAL  2.  Pt will be able to sit for at least 30 min without exaceration of pain. Baseline:  Goal status: INITIAL  3.  Pt will learn how to engage deep TA and perform dynamic closure of rectus diastasis with bed mobility and functional tasks.   Baseline:  Goal status: INITIAL  4.  Pt will report reduced pain by at least 20% with daily tasks. Baseline:  Goal status: INITIAL    LONG TERM GOALS: Target date: 09/04/22  Pt will be ind with advanced HEP without exacerbation of pain Baseline:  Goal status: INITIAL  2.  Pt will improve core and LE strength to at  least 4+/5 for improved lumbopelvic hip stability with functional tasks such as squatting and stairs. Baseline:  Goal status: INITIAL  3.  Pt will be able to sleep on sides with stretches of sleep at least 2-3 hours most nights of the week.   Baseline:  Goal status: INITIAL  4.  Pt will be able to sit for work for up to 1 hour before needing a change of position. Baseline:  Goal status: INITIAL  5.  Pt will improve ODI score to at 12/50 or lower to demo improved function.  Baseline:  Goal status: INITIAL    PLAN:  PT FREQUENCY: 1-2x/week  PT DURATION: 8 weeks  PLANNED INTERVENTIONS: Therapeutic exercises, Therapeutic activity, Neuromuscular re-education, Balance training, Patient/Family education, Self Care, Joint mobilization, Aquatic Therapy, Dry Needling, Electrical stimulation, Spinal mobilization, Moist heat, Taping, Traction, and Manual therapy.  PLAN FOR NEXT SESSION: review and progress HEP, strength of hip abd, rotators, extension, deep abdominal training (has DRA at umbilicus - had twins in 2014) - add band around thighs to add to  clam and bridge, may benefit from DN to lumbar spine and hips - did not discuss at evaluation   Morton Peters, PT 07/10/22 2:57 PM

## 2022-07-17 ENCOUNTER — Ambulatory Visit: Payer: BC Managed Care – PPO | Admitting: Physical Therapy

## 2022-08-01 ENCOUNTER — Ambulatory Visit: Payer: BC Managed Care – PPO | Attending: Rheumatology | Admitting: Physical Therapy

## 2022-08-01 ENCOUNTER — Encounter: Payer: Self-pay | Admitting: Physical Therapy

## 2022-08-01 DIAGNOSIS — M6281 Muscle weakness (generalized): Secondary | ICD-10-CM | POA: Diagnosis present

## 2022-08-01 DIAGNOSIS — M5459 Other low back pain: Secondary | ICD-10-CM | POA: Diagnosis present

## 2022-08-01 NOTE — Therapy (Addendum)
OUTPATIENT PHYSICAL THERAPY TREATMENT/DISCHARGE SUMMARY   Patient Name: Wendy Weaver MRN: [id number] DOB:26-Jun-1985, 37 y.o., female Today's Date: 08/01/2022  END OF SESSION:  PT End of Session - 08/01/22 1402     Visit Number 2    Date for PT Re-Evaluation 09/04/22    Authorization Type UHC Medicaid - no auth needed over 21    Authorization - Number of Visits 27    PT Start Time 1400    PT Stop Time 1444    PT Time Calculation (min) 44 min    Activity Tolerance Patient tolerated treatment well;No increased pain    Behavior During Therapy [facility] for tasks assessed/performed             Past Medical History:  Diagnosis Date   Back pain    Hip pain    Sickle cell trait (HCC)    Past Surgical History:  Procedure Laterality Date   arm surgery Right    right wrist   Patient Active Problem List   Diagnosis Date Noted   Hx of migraines 12/09/2021   Chronic idiopathic urticaria 12/09/2021    PCP: Kerin Salen, PA-C  REFERRING PROVIDER: Pollyann Savoy, MD  REFERRING DIAG: M53.3,G89.29 (ICD-10-CM) - Chronic SI joint pain M54.50,G89.29 (ICD-10-CM) - Chronic right-sided low back pain without sciatica  Rationale for Evaluation and Treatment: Rehabilitation  THERAPY DIAG:  No diagnosis found.  ONSET DATE: 2014 after having twins - has had 3 vaginal deliveries  SUBJECTIVE:                                                                                                                                                                                           SUBJECTIVE STATEMENT: Pt states that things are going well. No change from the eval. She is doing her HEP regularly.    Pain started in 2014 after having twins.  Used to have Rt LE shooting pain and weakness and leg would give out.  No more LE pain but weakness continues.  Have been going to gym 3x/week for past 8 mos - walks on TM and weight machines.  Rt low back is worse than Lt - feels like my hips.   Pain is worse with sitting and sleeping on either side.  I have had to leave several jobs b/c of pain.    PERTINENT HISTORY:  + ANA Polyarthralgia Bil knee and hand pain Hx of injections into SI joints - first round helped, second round didn't Goes to gym 3x/week - weights and cardio  PAIN:  PAIN:  Are you having pain? Yes NPRS scale: just stiff/10 today - pain range varies  Pain location: Rt > Lt SI joints Pain orientation: Bilateral  PAIN TYPE: aching, dull, and sharp Pain description: constant  Aggravating factors: sitting and trying to sleep, bending Relieving factors: heat   PRECAUTIONS: None  WEIGHT BEARING RESTRICTIONS: No  FALLS:  Has patient fallen in last 6 months? No  LIVING ENVIRONMENT: Lives with: lives with their family Lives in: House/apartment Stairs: Yes: Internal: 12 steps; can reach both and External: 6 steps; can reach both Has following equipment at home: None  OCCUPATION: starting new job tomorrow - doing pre-certifications - will need to be able to sit  PLOF: Independent  PATIENT GOALS: get some relief  NEXT MD VISIT: 3 mos  OBJECTIVE:   DIAGNOSTIC FINDINGS:  Pelvic xray: Impression: Unremarkable x-rays of the SI joints and hip joints  PATIENT SURVEYS:  Modified Oswestry 18/50   SCREENING FOR RED FLAGS: Bowel or bladder incontinence: No Spinal tumors: No Cauda equina syndrome: No Compression fracture: No Abdominal aneurysm: No  COGNITION: Overall cognitive status: Within functional limits for tasks assessed     SENSATION: WFL  MUSCLE LENGTH: Hamstrings: Right 70 deg; Left 70 deg End range piriformis and gluteals limited bil  POSTURE: forward head and increased lumbar lordosis  PALPATION: Tender along bil SI joints Pain with lumbar PA throughout lumbar spine  LUMBAR ROM:   AROM eval  Flexion Full with pain  Extension 15 deg with pain  Right lateral flexion Full with pain  Left lateral flexion Full with pain  Right  rotation 50%  Left rotation 50%   (Blank rows = not tested)  LOWER EXTREMITY ROM:    WNL bil  LOWER EXTREMITY MMT:   4/5 bil hips and knees  Umbilical diastasis 1.5 finger width - abdominals 3+/5   LUMBAR SPECIAL TESTS:  SI Compression/distraction test: Positive   GAIT: Distance walked: within clinic Assistive device utilized: None Level of assistance: Complete Independence Comments: slight out-toeing  TODAY'S TREATMENT:                                                                                                                              DATE:   08/01/22 Side clam yellow TB 2x10 reps  Bridge with yellow TB around knees, yellow TB resistance at hips 2x10 reps  Supine TrA activation with single leg reach 2" over table x10 reps Supine 90/90 bent knee lower x5 reps each Supine straight leg bridge over green physioball 2x10 reps Quadruped rocking adductor stretch x10 reps each side  Quadruped cat/cow x15 reps Standing BUE pressdown #10 x15 reps   07/10/22  Initiated HEP  Check all possible CPT codes: 13244- Therapeutic Exercise    Check all conditions that are expected to impact treatment: {Conditions expected to impact treatment:None of these apply   If treatment provided at initial evaluation, no treatment charged due to lack of authorization.        PATIENT EDUCATION:  Education details: [id number] Person educated: Patient Education method: Explanation,  Demonstration, Verbal cues, and Handouts Education comprehension: verbalized understanding and returned demonstration  HOME EXERCISE PROGRAM: Access Code: [email or web address] URL: [email or web address] Date: 08/01/2022 Prepared by: [facility]  Exercises - Supine Piriformis Stretch with Foot on Ground  - 1 x daily - 7 x weekly - 1 sets - 2 reps - 20-30 hold - Clam with Resistance  - 1 x daily - 7 x weekly - 3 sets - 10 reps - Supine Bridge with Resistance  Band  - 1 x daily - 7 x weekly - 3 sets - 10 reps - Hooklying Sequential Leg March and Lower  - 1 x daily - 7 x weekly - 3 sets - 10 reps  ASSESSMENT:  CLINICAL IMPRESSION: Pt has had minimal change in symptoms since her evaluation, which is expected considering the nature of her diagnosis. She has been working on her HEP consistently and several progressions were made with this today. Pt denied pain with therex. PT provided intermittent verbal cues to increase understanding of transverse abdominus activation. Ended without increase in pain.  OBJECTIVE IMPAIRMENTS: Abnormal gait, decreased coordination, decreased endurance, decreased mobility, difficulty walking, decreased ROM, decreased strength, hypomobility, impaired flexibility, impaired tone, improper body mechanics, and pain.   ACTIVITY LIMITATIONS: bending, sitting, squatting, sleeping, stairs, transfers, bed mobility, and locomotion level  PARTICIPATION LIMITATIONS: cleaning, laundry, driving, shopping, community activity, and yard work  PERSONAL FACTORS: Time since onset of injury/illness/exacerbation are also affecting patient's functional outcome.   REHAB POTENTIAL: Excellent  CLINICAL DECISION MAKING: Stable/uncomplicated  EVALUATION COMPLEXITY: Low   GOALS: Goals reviewed with patient? Yes  SHORT TERM GOALS: Target date: 08/07/22  Pt will be ind with initial HEP Baseline: Goal status: INITIAL  2.  Pt will be able to sit for at least 30 min without exaceration of pain. Baseline:  Goal status: INITIAL  3.  Pt will learn how to engage deep TA and perform dynamic closure of rectus diastasis with bed mobility and functional tasks.   Baseline:  Goal status: INITIAL  4.  Pt will report reduced pain by at least 20% with daily tasks. Baseline:  Goal status: INITIAL    LONG TERM GOALS: Target date: 09/04/22  Pt will be ind with advanced HEP without exacerbation of pain Baseline:  Goal status: INITIAL  2.  Pt will  improve core and LE strength to at least 4+/5 for improved lumbopelvic hip stability with functional tasks such as squatting and stairs. Baseline:  Goal status: INITIAL  3.  Pt will be able to sleep on sides with stretches of sleep at least 2-3 hours most nights of the week.   Baseline:  Goal status: INITIAL  4.  Pt will be able to sit for work for up to 1 hour before needing a change of position. Baseline:  Goal status: INITIAL  5.  Pt will improve ODI score to at 12/50 or lower to demo improved function.  Baseline:  Goal status: INITIAL    PLAN:  PT FREQUENCY: 1-2x/week  PT DURATION: 8 weeks  PLANNED INTERVENTIONS: Therapeutic exercises, Therapeutic activity, Neuromuscular re-education, Balance training, Patient/Family education, Self Care, Joint mobilization, Aquatic Therapy, Dry Needling, Electrical stimulation, Spinal mobilization, Moist heat, Taping, Traction, and Manual therapy.  PLAN FOR NEXT SESSION: progress HEP as able, strength of hip abd, rotators, extension, deep abdominal training (has DRA at umbilicus - had twins in 2014) may benefit from DN to lumbar spine and hips - did not discuss at evaluation   2:46  PM,08/01/22 Donita Brooks PT, DPT [facility] [place]  [phone]  PHYSICAL THERAPY DISCHARGE SUMMARY  Visits from Start of Care: 2  Current functional level related to goals / functional outcomes: Patient called after 2nd no-show.  She states she will be unable to make her appts due to the timing not being right.  Will discharge from PT at this time.    Remaining deficits: As above   Education / Equipment: Basic HEP   Patient agrees to discharge. Patient goals were not met. Patient is being discharged due to not returning since the last visit.  Lavinia Sharps, PT 08/17/22 2:25 PM Phone: [phone] Fax: [phone]

## 2022-08-08 ENCOUNTER — Ambulatory Visit: Payer: BC Managed Care – PPO | Admitting: Physical Therapy

## 2022-08-08 ENCOUNTER — Telehealth: Payer: Self-pay | Admitting: Physical Therapy

## 2022-08-08 NOTE — Telephone Encounter (Signed)
Called regarding no-show for today's appt and left message on voicemail.  Reminded of next appt on 7/25

## 2022-08-17 ENCOUNTER — Ambulatory Visit: Payer: BC Managed Care – PPO | Admitting: Physical Therapy

## 2022-08-24 ENCOUNTER — Encounter: Payer: BC Managed Care – PPO | Admitting: Physical Therapy

## 2022-08-31 ENCOUNTER — Encounter: Payer: BC Managed Care – PPO | Admitting: Physical Therapy

## 2022-11-21 DIAGNOSIS — Z23 Encounter for immunization: Secondary | ICD-10-CM | POA: Diagnosis not present

## 2022-12-19 DIAGNOSIS — Z3042 Encounter for surveillance of injectable contraceptive: Secondary | ICD-10-CM | POA: Diagnosis not present

## 2023-01-18 ENCOUNTER — Telehealth: Payer: Commercial Managed Care - PPO | Admitting: Family Medicine

## 2023-01-18 DIAGNOSIS — H6991 Unspecified Eustachian tube disorder, right ear: Secondary | ICD-10-CM | POA: Diagnosis not present

## 2023-01-18 MED ORDER — FLUTICASONE PROPIONATE 50 MCG/ACT NA SUSP: 2.0000 | Freq: Every day | NASAL | 0 refills | Status: DC

## 2023-01-18 NOTE — Progress Notes (Signed)
Virtual Visit Consent   Wendy Weaver, you are scheduled for a virtual visit with a [facility] provider today. Just as with appointments in the office, your consent must be obtained to participate. Your consent will be active for this visit and any virtual visit you may have with one of our providers in the next 365 days. If you have a MyChart account, a copy of this consent can be sent to you electronically.  As this is a virtual visit, video technology does not allow for your provider to perform a traditional examination. This may limit your provider's ability to fully assess your condition. If your provider identifies any concerns that need to be evaluated in person or the need to arrange testing (such as labs, EKG, etc.), we will make arrangements to do so. Although advances in technology are sophisticated, we cannot ensure that it will always work on either your end or our end. If the connection with a video visit is poor, the visit may have to be switched to a telephone visit. With either a video or telephone visit, we are not always able to ensure that we have a secure connection.  By engaging in this virtual visit, you consent to the provision of healthcare and authorize for your insurance to be billed (if applicable) for the services provided during this visit. Depending on your insurance coverage, you may receive a charge related to this service.  I need to obtain your verbal consent now. Are you willing to proceed with your visit today? Wendy Weaver has provided verbal consent on 01/18/2023 for a virtual visit (video or telephone). Freddy Finner, NP  Date: 01/18/2023 2:31 PM  Virtual Visit via Video Note   I, Freddy Finner, connected with  Wendy Weaver  ([id number], Jun 11, 1985) on 01/18/23 at  3:30 PM EST by a video-enabled telemedicine application and verified that I am speaking with the correct person using two identifiers.  Location: Patient: Virtual Visit Location Patient:  Home Provider: Virtual Visit Location Provider: Home Office   I discussed the limitations of evaluation and management by telemedicine and the availability of in person appointments. The patient expressed understanding and agreed to proceed.    History of Present Illness: Wendy Weaver is a 37 y.o. who identifies as a female who was assigned female at birth, and is being seen today for ear pain   Onset was yesterday. Having behind the right ear pain along the jaw line, feeling drainage nasal as well. Having pain behind ear with eating and feeling off balance. Crackling sensation.    Problems:  Patient Active Problem List   Diagnosis Date Noted   Hx of migraines 12/09/2021   Chronic idiopathic urticaria 12/09/2021    Allergies: No Known Allergies Medications:  Current Outpatient Medications:    Acetaminophen (TYLENOL PO), Take by mouth as needed., Disp: , Rfl:    albuterol (VENTOLIN HFA) 108 (90 Base) MCG/ACT inhaler, Inhale 1 puff into the lungs every 6 (six) hours as needed for wheezing or shortness of breath. (Patient not taking: Reported on 07/04/2022), Disp: 18 g, Rfl: 0   IBUPROFEN PO, Take by mouth as needed., Disp: , Rfl:    meloxicam (MOBIC) 15 MG tablet, Take 1 tablet by mouth daily., Disp: 30 tablet, Rfl: 1   nirmatrelvir & ritonavir (PAXLOVID, 300/100,) 20 x 150 MG & 10 x 100MG  TBPK, Take 2 tablets nirmtrelvir and 1 tablet ritonavir twice daily. (Patient not taking: Reported on 07/04/2022), Disp: 30 tablet, Rfl: 0  predniSONE (DELTASONE) 20 MG tablet, Take 2 tablets daily with breakfast. (Patient not taking: Reported on 07/04/2022), Disp: 10 tablet, Rfl: 0   promethazine-dextromethorphan (PROMETHAZINE-DM) 6.25-15 MG/5ML syrup, Take 5 mLs by mouth 3 (three) times daily as needed for cough. (Patient not taking: Reported on 07/04/2022), Disp: 200 mL, Rfl: 0  Observations/Objective: Patient is well-developed, well-nourished in no acute distress.  Resting comfortably at home.   Head is normocephalic, atraumatic.  No labored breathing.  Speech is clear and coherent with logical content.  Patient is alert and oriented at baseline.    Assessment and Plan:   1. Eustachian tube dysfunction, right (Primary)  - fluticasone (FLONASE) 50 MCG/ACT nasal spray; Place 2 sprays into both nostrils daily.  Dispense: 16 g; Refill: 0  -use flonase as directed  -follow up if not improving    Reviewed side effects, risks and benefits of medication.    Patient acknowledged agreement and understanding of the plan.   Past Medical, Surgical, Social History, Allergies, and Medications have been Reviewed.    Follow Up Instructions: I discussed the assessment and treatment plan with the patient. The patient was provided an opportunity to ask questions and all were answered. The patient agreed with the plan and demonstrated an understanding of the instructions.  A copy of instructions were sent to the patient via MyChart unless otherwise noted below.    The patient was advised to call back or seek an in-person evaluation if the symptoms worsen or if the condition fails to improve as anticipated.    Freddy Finner, NP

## 2023-01-18 NOTE — Patient Instructions (Addendum)
Manning Charity, thank you for joining Freddy Finner, NP for today's virtual visit.  While this provider is not your primary care provider (PCP), if your PCP is located in our provider database this encounter information will be shared with them immediately following your visit.   A [facility] MyChart account gives you access to today's visit and all your visits, tests, and labs performed at [facility] " click here if you don't have a [facility] MyChart account or go to [email or web address]  Consent: (Patient) [facility] provided verbal consent for this virtual visit at the beginning of the encounter.  Current Medications:  Current Outpatient Medications:    Acetaminophen (TYLENOL PO), Take by mouth as needed., Disp: , Rfl:    albuterol (VENTOLIN HFA) 108 (90 Base) MCG/ACT inhaler, Inhale 1 puff into the lungs every 6 (six) hours as needed for wheezing or shortness of breath. (Patient not taking: Reported on 07/04/2022), Disp: 18 g, Rfl: 0   IBUPROFEN PO, Take by mouth as needed., Disp: , Rfl:    meloxicam (MOBIC) 15 MG tablet, Take 1 tablet by mouth daily., Disp: 30 tablet, Rfl: 1   nirmatrelvir & ritonavir (PAXLOVID, 300/100,) 20 x 150 MG & 10 x 100MG  TBPK, Take 2 tablets nirmtrelvir and 1 tablet ritonavir twice daily. (Patient not taking: Reported on 07/04/2022), Disp: 30 tablet, Rfl: 0   predniSONE (DELTASONE) 20 MG tablet, Take 2 tablets daily with breakfast. (Patient not taking: Reported on 07/04/2022), Disp: 10 tablet, Rfl: 0   promethazine-dextromethorphan (PROMETHAZINE-DM) 6.25-15 MG/5ML syrup, Take 5 mLs by mouth 3 (three) times daily as needed for cough. (Patient not taking: Reported on 07/04/2022), Disp: 200 mL, Rfl: 0   Medications ordered in this encounter:  No orders of the defined types were placed in this encounter.    *If you need refills on other medications prior to your next appointment, please contact your pharmacy*  Follow-Up: Call back or seek  an in-person evaluation if the symptoms worsen or if the condition fails to improve as anticipated.  [facility] [phone]  Other Instructions  Eustachian Tube Dysfunction  Eustachian tube dysfunction refers to a condition in which a blockage develops in the narrow passage that connects the middle ear to the back of the nose (eustachian tube). The eustachian tube regulates air pressure in the middle ear by letting air move between the ear and nose. It also helps to drain fluid from the middle ear space. Eustachian tube dysfunction can affect one or both ears. When the eustachian tube does not function properly, air pressure, fluid, or both can build up in the middle ear. What are the causes? This condition occurs when the eustachian tube becomes blocked or cannot open normally. Common causes of this condition include: Ear infections. Colds and other infections that affect the nose, mouth, and throat (upper respiratory tract). Allergies. Irritation from cigarette smoke. Irritation from stomach acid coming up into the esophagus (gastroesophageal reflux). The esophagus is the part of the body that moves food from the mouth to the stomach. Sudden changes in air pressure, such as from descending in an airplane or scuba diving. Abnormal growths in the nose or throat, such as: Growths that line the nose (nasal polyps). Abnormal growth of cells (tumors). Enlarged tissue at the back of the throat (adenoids). What increases the risk? You are more likely to develop this condition if: You smoke. You are overweight. You are a child who has: Certain birth defects of the mouth, such  as cleft palate. Large tonsils or adenoids. What are the signs or symptoms? Common symptoms of this condition include: A feeling of fullness in the ear. Ear pain. Clicking or popping noises in the ear. Ringing in the ear (tinnitus). Hearing loss. Loss of balance. Dizziness. Symptoms may get worse  when the air pressure around you changes, such as when you travel to an area of high elevation, fly on an airplane, or go scuba diving. How is this diagnosed? This condition may be diagnosed based on: Your symptoms. A physical exam of your ears, nose, and throat. Tests, such as those that measure: The movement of your eardrum. Your hearing (audiometry). How is this treated? Treatment depends on the cause and severity of your condition. In mild cases, you may relieve your symptoms by moving air into your ears. This is called "popping the ears." In more severe cases, or if you have symptoms of fluid in your ears, treatment may include: Medicines to relieve congestion (decongestants). Medicines that treat allergies (antihistamines). Nasal sprays or ear drops that contain medicines that reduce swelling (steroids). A procedure to drain the fluid in your eardrum. In this procedure, a small tube may be placed in the eardrum to: Drain the fluid. Restore the air in the middle ear space. A procedure to insert a balloon device through the nose to inflate the opening of the eustachian tube (balloon dilation). Follow these instructions at home: Lifestyle Do not do any of the following until your health care provider approves: Travel to high altitudes. Fly in airplanes. Work in a Estate agent or room. Scuba dive. Do not use any products that contain nicotine or tobacco. These products include cigarettes, chewing tobacco, and vaping devices, such as e-cigarettes. If you need help quitting, ask your health care provider. Keep your ears dry. Wear fitted earplugs during showering and bathing. Dry your ears completely after. General instructions Take over-the-counter and prescription medicines only as told by your health care provider. Use techniques to help pop your ears as recommended by your health care provider. These may include: Chewing gum. Yawning. Frequent, forceful swallowing. Closing  your mouth, holding your nose closed, and gently blowing as if you are trying to blow air out of your nose. Keep all follow-up visits. This is important. Contact a health care provider if: Your symptoms do not go away after treatment. Your symptoms come back after treatment. You are unable to pop your ears. You have: A fever. Pain in your ear. Pain in your head or neck. Fluid draining from your ear. Your hearing suddenly changes. You become very dizzy. You lose your balance. Get help right away if: You have a sudden, severe increase in any of your symptoms. Summary Eustachian tube dysfunction refers to a condition in which a blockage develops in the eustachian tube. It can be caused by ear infections, allergies, inhaled irritants, or abnormal growths in the nose or throat. Symptoms may include ear pain or fullness, hearing loss, or ringing in the ears. Mild cases are treated with techniques to unblock the ears, such as yawning or chewing gum. More severe cases are treated with medicines or procedures. This information is not intended to replace advice given to you by your health care provider. Make sure you discuss any questions you have with your health care provider. Document Revised: 03/22/2020 Document Reviewed: 03/22/2020 Elsevier Patient Education  2024 [employer].    If you have been instructed to have an in-person evaluation today at a local Urgent Care facility,  please use the link below. It will take you to a list of all of our available [facility], including address, phone number and hours of operation. Please do not delay care.  [facility]  If you or a family member do not have a primary care provider, use the link below to schedule a visit and establish care. When you choose a [facility] primary care physician or advanced practice provider, you gain a long-term partner in health. Find a Primary Care Provider  Learn more about [facility]'s in-office and virtual care options: [facility] - Get Care Now

## 2023-02-18 DIAGNOSIS — Z111 Encounter for screening for respiratory tuberculosis: Secondary | ICD-10-CM | POA: Diagnosis not present

## 2023-02-21 ENCOUNTER — Ambulatory Visit
Admission: EM | Admit: 2023-02-21 | Discharge: 2023-02-21 | Disposition: A | Discharge status: Home / Self Care | Payer: Commercial Managed Care - PPO | Attending: Family Medicine | Admitting: Family Medicine

## 2023-02-21 ENCOUNTER — Other Ambulatory Visit: Payer: Self-pay

## 2023-02-21 DIAGNOSIS — R509 Fever, unspecified: Secondary | ICD-10-CM | POA: Diagnosis not present

## 2023-02-21 DIAGNOSIS — R051 Acute cough: Secondary | ICD-10-CM

## 2023-02-21 DIAGNOSIS — J09X2 Influenza due to identified novel influenza A virus with other respiratory manifestations: Secondary | ICD-10-CM | POA: Diagnosis not present

## 2023-02-21 LAB — POCT INFLUENZA A/B
Influenza A, POC: POSITIVE — AB
Influenza B, POC: NEGATIVE

## 2023-02-21 MED ORDER — IBUPROFEN 800 MG PO TABS
800.0000 mg | ORAL_TABLET | Freq: Once | ORAL | Status: AC
  Administered 2023-02-21: 800 mg via ORAL

## 2023-02-21 MED ORDER — OSELTAMIVIR PHOSPHATE 75 MG PO CAPS: 75.0000 mg | ORAL_CAPSULE | Freq: Two times a day (BID) | ORAL | 0 refills | Status: AC

## 2023-02-21 NOTE — Discharge Instructions (Signed)
Supportive Care Medications  These are medications that may help with your symptoms. However, Please note that if your PCP has told you not to use certain products please follow his/her recommendations.  For example: - if you have high BP you should use something similar to Coricidin HPB, not Sudafed or antihistamines with a "D" such as Allegra D. The "D" means decongestant.  (Afrin is safe to use with HBP, but do not use longer than 3 days) -Higher Doses of NSAIDS (Ibuprofen, Aleve etc) with Kidney disease or poorly controlled BP.  Fever, Body aches, Headache  Ibuprofen 200 mg 2 tablets and Acetaminophen 2 tabs 4 times a day just before meals and at bedtime (every 6 hours)    Do not use NSAIDS if you are allergic to NSAIDS, if you have been given oral steroids-prednisone, methylprednisolone, dexamethasone until your steroids are finished, if you are pregnant or breast feeding, or if you have history of kidney or liver disease.   Sore Throat: Cepacol throat lozenges or spray  Cough Drops Warm salt water gargles  How to make saltwater rinses Use warm water, because warmth is more relieving to a sore throat than cold water. Warm water will also help the salt dissolve into the water more effectively. Use any type of salt you have available. Most saltwater rinse recipes call for 8 ounces of warm water and 1 teaspoon of salt. However, if your mouth is tender and the saltwater rinse stings, decrease the salt to a 1/2 teaspoon for the first 1 to 2 days. Bring water to a boil, then remove from heat, add salt, and stir. Let the saltwater cool to a warm temperature before rinsing with it. Once you have finished your rinse, discard leftover solution to avoid contamination.  Nasal Congestion: Afrin (Oxymetazoline) 1 squirt in each side of nose 2 times daily for 3 days only.  Oral decongestants like sudafed but only if you do not have high Blood Pressure if you have high Blood Pressure take CORICIDIN  HBP Antihistamines Nasal Saline/Neti Pot  Cough: Expectorants (guaifenesin) help thin mucus making it easier to get up. This should be used during the day. During the day coughing helps bring mucus up out of your lungs Suppressants (dextromethorphan) just for bedtime so you can sleep   Oral rehydration is important when you have been sweating more than usual and/or having nausea and vomiting. You can use over the counter rehydration supplements like Pedialyte sport, Gatorlyte, Electrolit, Liquid IV, or you can make your own at home. Recipe:  Mix 1 liter of clean or boiled water with 6 teaspoons (2 tablespoons) of sugar and 1/2 tsp of salt. Stir until both dissolve.  You can add sugar free flavoring (i.e. Crystal light) if desired.  Drink small sips frequently rather than large amounts at once to prevent nausea.

## 2023-02-21 NOTE — ED Triage Notes (Signed)
Pt presents with complaints of flu-like symptoms. Pt states she is experiencing fevers, chills, body aches, cough, nasal congestion, sore throat, and chest discomfort. Pt states her symptoms began yesterday however has worsened today. Exposed to Flu by son who was recently diagnosed. Pt currently rates her overall pain a 6/10. Ibuprofen and Dayquil taken with no relief.

## 2023-02-21 NOTE — ED Provider Notes (Signed)
[facility]    CSN: [id number] Arrival date & time: 02/21/23  1614      History   Chief Complaint Chief Complaint  Patient presents with   Fever    HPI Wendy Weaver is a 38 y.o. female.   The history is provided by the patient.  Fever Associated symptoms: chills, congestion, cough, headaches, myalgias, rhinorrhea and sore throat   Associated symptoms: no ear pain, no nausea, no rash and no vomiting   Not feeling well since last p.m., symptoms include cough, fever, chills, body aches, fatigue, nasal congestion, rhinorrhea, headache.  Son diagnosed with flu recently admits chest soreness when she coughs.  Denies shortness of breath, abdominal pain, nausea, vomiting, diarrhea, rashes or skin changes.  Took ibuprofen at 6 AM.  Past Medical History:  Diagnosis Date   Back pain    Hip pain    Sickle cell trait [facility])     Patient Active Problem List   Diagnosis Date Noted   Hx of migraines 12/09/2021   Chronic idiopathic urticaria 12/09/2021    Past Surgical History:  Procedure Laterality Date   arm surgery Right    right wrist    OB History     Gravida  4   Para  2   Term  2   Preterm  0   AB  1   Living  2      SAB  1   IAB  0   Ectopic  0   Multiple  0   Live Births               Home Medications    Prior to Admission medications   Medication Sig Start Date End Date Taking? Authorizing Provider  Acetaminophen (TYLENOL PO) Take by mouth as needed.    [provider]  albuterol (VENTOLIN HFA) 108 (90 Base) MCG/ACT inhaler Inhale 1 puff into the lungs every 6 (six) hours as needed for wheezing or shortness of breath. Patient not taking: Reported on 07/04/2022 01/23/22   Wallis Bamberg, PA-C  fluticasone [facility]) 50 MCG/ACT nasal spray Place 2 sprays into both nostrils daily. 01/18/23   Freddy Finner, NP  IBUPROFEN PO Take by mouth as needed.   Yes [provider]  meloxicam (MOBIC) 15 MG tablet Take 1 tablet by  mouth daily. 03/10/21     nirmatrelvir & ritonavir (PAXLOVID, 300/100,) 20 x 150 MG & 10 x 100MG  TBPK Take 2 tablets nirmtrelvir and 1 tablet ritonavir twice daily. Patient not taking: Reported on 07/04/2022 01/23/22   Wallis Bamberg, PA-C  predniSONE (DELTASONE) 20 MG tablet Take 2 tablets daily with breakfast. Patient not taking: Reported on 07/04/2022 01/23/22   Wallis Bamberg, PA-C  promethazine-dextromethorphan (PROMETHAZINE-DM) 6.25-15 MG/5ML syrup Take 5 mLs by mouth 3 (three) times daily as needed for cough. Patient not taking: Reported on 07/04/2022 01/23/22   Wallis Bamberg, PA-C    Family History Family History  Problem Relation Age of Onset   Diabetes Mother    Hypertension Mother    Arthritis Mother    Hypertension Father    Hypertension Sister     Social History Social History   Tobacco Use   Smoking status: Every Day    Current packs/day: 0.50    Average packs/day: 0.5 packs/day for 17.0 years (8.5 ttl pk-yrs)    Types: Cigarettes    Passive exposure: Never   Smokeless tobacco: Never  Vaping Use   Vaping status: Never Used  Substance Use Topics  Alcohol use: Yes    Comment: occ   Drug use: No     Allergies   Patient has no known allergies.   Review of Systems Review of Systems  Constitutional:  Positive for chills, fatigue and fever. Negative for appetite change.  HENT:  Positive for congestion, rhinorrhea and sore throat. Negative for ear pain, trouble swallowing and voice change.   Respiratory:  Positive for cough. Negative for shortness of breath and wheezing.   Gastrointestinal:  Negative for abdominal pain, nausea and vomiting.  Musculoskeletal:  Positive for back pain and myalgias.  Skin:  Negative for rash.  Neurological:  Positive for headaches.     Physical Exam Triage Vital Signs ED Triage Vitals  Encounter Vitals Group     BP 02/21/23 1644 (!) 147/88     Systolic BP Percentile --      Diastolic BP Percentile --      Pulse Rate 02/21/23 1644 (!) 108      Resp 02/21/23 1644 18     Temp 02/21/23 1644 (!) 100.5 F (38.1 C)     Temp Source 02/21/23 1644 Oral     SpO2 02/21/23 1644 98 %     Weight 02/21/23 1642 193 lb (87.5 kg)     Height 02/21/23 1642 5\' 7"  (1.702 m)     Head Circumference --      Peak Flow --      Pain Score 02/21/23 1641 6     Pain Loc --      Pain Education --      Exclude from Growth Chart --    No data found.  Updated Vital Signs BP (!) 147/88 (BP Location: Right Arm)   Pulse (!) 108   Temp (!) 100.5 F (38.1 C) (Oral)   Resp 18   Ht 5\' 7"  (1.702 m)   Wt 193 lb (87.5 kg)   LMP 09/14/2022 (Exact Date)   SpO2 98%   Breastfeeding No   BMI 30.23 kg/m   Visual Acuity Right Eye Distance:   Left Eye Distance:   Bilateral Distance:    Right Eye Near:   Left Eye Near:    Bilateral Near:     Physical Exam Vitals and nursing note reviewed.  Constitutional:      Appearance: She is not ill-appearing.  HENT:     Head: Normocephalic and atraumatic.     Right Ear: Tympanic membrane and ear canal normal.     Left Ear: Tympanic membrane normal.     Nose: Congestion and rhinorrhea present.     Mouth/Throat:     Mouth: Mucous membranes are moist.     Pharynx: No oropharyngeal exudate or posterior oropharyngeal erythema.  Eyes:     Conjunctiva/sclera: Conjunctivae normal.  Cardiovascular:     Rate and Rhythm: Regular rhythm. Tachycardia present.     Heart sounds: Normal heart sounds.  Pulmonary:     Effort: Pulmonary effort is normal. No respiratory distress.     Breath sounds: No wheezing, rhonchi or rales.  Musculoskeletal:     Cervical back: Neck supple.  Lymphadenopathy:     Cervical: No cervical adenopathy.  Skin:    General: Skin is warm.  Neurological:     Mental Status: She is oriented to person, place, and time.      UC Treatments / Results  Labs (all labs ordered are listed, but only abnormal results are displayed) Labs Reviewed  POCT INFLUENZA A/B    EKG   Radiology  No  results found.  Procedures Procedures (including critical care time)  Medications Ordered in UC Medications  ibuprofen (ADVIL) tablet 800 mg (has no administration in time range)    Initial Impression / Assessment and Plan / UC Course  I have reviewed the triage vital signs and the nursing notes.  Pertinent labs & imaging results that were available during my care of the patient were reviewed by me and considered in my medical decision making (see chart for details).     38 year old female exposed to flu at home presents with fever, body aches, fatigue, nasal congestion, rhinorrhea and cough.  She appears to not feel well but not toxic appearing she has low-grade fever at 100.5 tachycardic to 108, has nasal congestion and rhinorrhea on exam otherwise exam is unremarkable. Point-of-care flu faintly positive for influenza A, will treat based on exposure and symptoms Final Clinical Impressions(s) / UC Diagnoses   Final diagnoses:  None   Discharge Instructions   None    ED Prescriptions   None    PDMP not reviewed this encounter.   Meliton Rattan, [place] 02/21/23 [phone]

## 2023-03-23 ENCOUNTER — Encounter (HOSPITAL_BASED_OUTPATIENT_CLINIC_OR_DEPARTMENT_OTHER): Payer: Self-pay | Admitting: Urology

## 2023-03-23 ENCOUNTER — Emergency Department (HOSPITAL_BASED_OUTPATIENT_CLINIC_OR_DEPARTMENT_OTHER): Payer: Commercial Managed Care - PPO

## 2023-03-23 ENCOUNTER — Emergency Department (HOSPITAL_BASED_OUTPATIENT_CLINIC_OR_DEPARTMENT_OTHER)
Admission: EM | Admit: 2023-03-23 | Discharge: 2023-03-23 | Disposition: A | Discharge status: Home / Self Care | Payer: Commercial Managed Care - PPO | Attending: Emergency Medicine | Admitting: Emergency Medicine

## 2023-03-23 DIAGNOSIS — F17210 Nicotine dependence, cigarettes, uncomplicated: Secondary | ICD-10-CM | POA: Insufficient documentation

## 2023-03-23 DIAGNOSIS — R1013 Epigastric pain: Secondary | ICD-10-CM | POA: Diagnosis present

## 2023-03-23 DIAGNOSIS — R1011 Right upper quadrant pain: Secondary | ICD-10-CM | POA: Diagnosis not present

## 2023-03-23 LAB — COMPREHENSIVE METABOLIC PANEL
ALT: 17 U/L (ref 0–44)
AST: 18 U/L (ref 15–41)
Albumin: 3.9 g/dL (ref 3.5–5.0)
Alkaline Phosphatase: 44 U/L (ref 38–126)
Anion gap: 10 (ref 5–15)
BUN: 10 mg/dL (ref 6–20)
CO2: 21 mmol/L — ABNORMAL LOW (ref 22–32)
Calcium: 8.9 mg/dL (ref 8.9–10.3)
Chloride: 107 mmol/L (ref 98–111)
Creatinine, Ser: 0.67 mg/dL (ref 0.44–1.00)
GFR, Estimated: >60 mL/min (ref >60)
Glucose, Bld: 102 mg/dL — ABNORMAL HIGH (ref 70–99)
Potassium: 3.6 mmol/L (ref 3.5–5.1)
Sodium: 138 mmol/L (ref 135–145)
Total Bilirubin: 0.5 mg/dL (ref 0.0–1.2)
Total Protein: 7.5 g/dL (ref 6.5–8.1)

## 2023-03-23 LAB — URINALYSIS, MICROSCOPIC (REFLEX)

## 2023-03-23 LAB — CBC
HCT: 34.5 % — ABNORMAL LOW (ref 36.0–46.0)
Hemoglobin: 11.7 g/dL — ABNORMAL LOW (ref 12.0–15.0)
MCH: 28.4 pg (ref 26.0–34.0)
MCHC: 33.9 g/dL (ref 30.0–36.0)
MCV: 83.7 fL (ref 80.0–100.0)
Platelets: 345 10*3/uL (ref 150–400)
RBC: 4.12 MIL/uL (ref 3.87–5.11)
RDW: 15.1 % (ref 11.5–15.5)
WBC: 11.1 10*3/uL — ABNORMAL HIGH (ref 4.0–10.5)
nRBC: 0 % (ref 0.0–0.2)

## 2023-03-23 LAB — URINALYSIS, ROUTINE W REFLEX MICROSCOPIC
Bilirubin Urine: NEGATIVE
Glucose, UA: NEGATIVE mg/dL
Ketones, ur: NEGATIVE mg/dL
Leukocytes,Ua: NEGATIVE
Nitrite: NEGATIVE
Protein, ur: NEGATIVE mg/dL
Specific Gravity, Urine: >=1.03 (ref 1.005–1.030)
pH: 6 (ref 5.0–8.0)

## 2023-03-23 LAB — PREGNANCY, URINE: Preg Test, Ur: NEGATIVE

## 2023-03-23 LAB — LIPASE, BLOOD: Lipase: 54 U/L — ABNORMAL HIGH (ref 11–51)

## 2023-03-23 MED ORDER — SUCRALFATE 1 G PO TABS: 1.0000 g | ORAL_TABLET | Freq: Three times a day (TID) | ORAL | 0 refills | Status: DC

## 2023-03-23 MED ORDER — PANTOPRAZOLE SODIUM 20 MG PO TBEC: 20.0000 mg | DELAYED_RELEASE_TABLET | Freq: Every day | ORAL | 0 refills | Status: AC

## 2023-03-23 MED ORDER — SODIUM CHLORIDE 0.9 % IV BOLUS
1000.0000 mL | Freq: Once | INTRAVENOUS | Status: AC
  Administered 2023-03-23: 1000 mL via INTRAVENOUS

## 2023-03-23 MED ORDER — ALUM & MAG HYDROXIDE-SIMETH 200-200-20 MG/5ML PO SUSP
30.0000 mL | Freq: Once | ORAL | Status: AC
  Administered 2023-03-23: 30 mL via ORAL
  Filled 2023-03-23: qty 30

## 2023-03-23 MED ORDER — LIDOCAINE VISCOUS HCL 2 % MT SOLN
15.0000 mL | Freq: Once | OROMUCOSAL | Status: AC
  Administered 2023-03-23: 15 mL via ORAL
  Filled 2023-03-23: qty 15

## 2023-03-23 NOTE — ED Provider Notes (Signed)
 [place] [facility] AT [facility] Provider Note  CSN: [id number] Arrival date & time: 03/23/23 1622  Chief Complaint(s) Abdominal Pain  HPI Wendy Weaver is a 38 y.o. female with past medical history as below, significant for back pain, sickle cell trait, migraines who presents to the ED with complaint of epigastric pain postprandial  Symptoms ongoing over the past 10 days.  Postprandial pain to the epigastrium, burning, sharp sensation, occasionally radiates to her right upper quadrant and back.  No nausea or vomiting.  No change in bowel or bladder function, no BRBPR or melena, no light-colored stool.  No fevers, chills, jaundice.  No recent diet or medication changes.  Denies similar symptoms the past.  Symptoms seem worse after eating greasy or fried foods spicy foods  Past Medical History Past Medical History:  Diagnosis Date   Back pain    Hip pain    Sickle cell trait [facility])    Patient Active Problem List   Diagnosis Date Noted   Hx of migraines 12/09/2021   Chronic idiopathic urticaria 12/09/2021   Home Medication(s) Prior to Admission medications   Medication Sig Start Date End Date Taking? Authorizing Provider  pantoprazole (PROTONIX) 20 MG tablet Take 1 tablet (20 mg total) by mouth daily for 14 days. 03/23/23 04/06/23 Yes Tanda Rockers A, DO  sucralfate (CARAFATE) 1 g tablet Take 1 tablet (1 g total) by mouth with breakfast, with lunch, and with evening meal for 7 days. 03/23/23 03/30/23 Yes Tanda Rockers A, DO  IBUPROFEN PO Take by mouth as needed.    [provider]                                                                                                                                    Past Surgical History Past Surgical History:  Procedure Laterality Date   arm surgery Right    right wrist   Family History Family History  Problem Relation Age of Onset   Diabetes Mother    Hypertension Mother    Arthritis Mother     Hypertension Father    Hypertension Sister     Social History Social History   Tobacco Use   Smoking status: Every Day    Current packs/day: 0.50    Average packs/day: 0.5 packs/day for 17.0 years (8.5 ttl pk-yrs)    Types: Cigarettes    Passive exposure: Never   Smokeless tobacco: Never  Vaping Use   Vaping status: Never Used  Substance Use Topics   Alcohol use: Yes    Comment: occ   Drug use: No   Allergies Patient has no known allergies.  Review of Systems A thorough review of systems was obtained and all systems are negative except as noted in the HPI and PMH.   Physical Exam Vital Signs  I have reviewed the triage vital signs BP 123/80   Pulse 76   Temp 98.1 F (  36.7 C) (Oral)   Resp 15   Ht 5\' 7"  (1.702 m)   Wt 93 kg   LMP 09/14/2022 (Exact Date)   SpO2 100%   BMI 32.11 kg/m  Physical Exam Vitals and nursing note reviewed.  Constitutional:      General: She is not in acute distress.    Appearance: Normal appearance.  HENT:     Head: Normocephalic and atraumatic.     Right Ear: External ear normal.     Left Ear: External ear normal.     Nose: Nose normal.     Mouth/Throat:     Mouth: Mucous membranes are moist.  Eyes:     General: No scleral icterus.       Right eye: No discharge.        Left eye: No discharge.  Cardiovascular:     Rate and Rhythm: Normal rate and regular rhythm.     Pulses: Normal pulses.     Heart sounds: Normal heart sounds.  Pulmonary:     Effort: Pulmonary effort is normal. No respiratory distress.     Breath sounds: Normal breath sounds. No stridor.  Abdominal:     General: Abdomen is flat. There is no distension.     Palpations: Abdomen is soft.     Tenderness: There is abdominal tenderness in the epigastric area.  Musculoskeletal:     Cervical back: No rigidity.     Right lower leg: No edema.     Left lower leg: No edema.  Skin:    General: Skin is warm and dry.     Capillary Refill: Capillary refill takes less  than 2 seconds.  Neurological:     Mental Status: She is alert.  Psychiatric:        Mood and Affect: Mood normal.        Behavior: Behavior normal. Behavior is cooperative.     ED Results and Treatments Labs (all labs ordered are listed, but only abnormal results are displayed) Labs Reviewed  LIPASE, BLOOD - Abnormal; Notable for the following components:      Result Value   Lipase 54 (*)    All other components within normal limits  COMPREHENSIVE METABOLIC PANEL - Abnormal; Notable for the following components:   CO2 21 (*)    Glucose, Bld 102 (*)    All other components within normal limits  CBC - Abnormal; Notable for the following components:   WBC 11.1 (*)    Hemoglobin 11.7 (*)    HCT 34.5 (*)    All other components within normal limits  URINALYSIS, ROUTINE W REFLEX MICROSCOPIC - Abnormal; Notable for the following components:   Hgb urine dipstick TRACE (*)    All other components within normal limits  URINALYSIS, MICROSCOPIC (REFLEX) - Abnormal; Notable for the following components:   Bacteria, UA RARE (*)    All other components within normal limits  PREGNANCY, URINE  Radiology US Abdomen Limited RUQ (LIVER/GB) Result Date: 03/23/2023 CLINICAL DATA:  Right upper quadrant pain EXAM: ULTRASOUND ABDOMEN LIMITED RIGHT UPPER QUADRANT COMPARISON:  None Available. FINDINGS: Gallbladder: No gallstones or wall thickening visualized. No sonographic Murphy sign noted by sonographer. Common bile duct: Diameter: Normal, 3 mm Liver: No focal lesion identified. Within normal limits in parenchymal echogenicity. Portal vein is patent on color Doppler imaging with normal direction of blood flow towards the liver. Other: None. IMPRESSION: Normal right upper quadrant ultrasound. No explanation for patient's symptoms. Electronically Signed   By: Jeronimo Greaves M.D.   On:  03/23/2023 18:38    Pertinent labs & imaging results that were available during my care of the patient were reviewed by me and considered in my medical decision making (see MDM for details).  Medications Ordered in ED Medications  alum & mag hydroxide-simeth (MAALOX/MYLANTA) 200-200-20 MG/5ML suspension 30 mL (30 mLs Oral Given 03/23/23 2218)    And  lidocaine (XYLOCAINE) 2 % viscous mouth solution 15 mL (15 mLs Oral Given 03/23/23 2218)  sodium chloride 0.9 % bolus 1,000 mL (0 mLs Intravenous Stopped 03/23/23 2325)                                                                                                                                     Procedures Procedures  (including critical care time)  Medical Decision Making / ED Course    Medical Decision Making:    Dalexa Gentz is a 38 y.o. female with past medical history as below, significant for back pain, sickle cell trait, migraines who presents to the ED with complaint of epigastric pain postprandial. The complaint involves an extensive differential diagnosis and also carries with it a high risk of complications and morbidity.  Serious etiology was considered. Ddx includes but is not limited to: Differential diagnosis includes but is not exclusive to acute cholecystitis, intrathoracic causes for epigastric abdominal pain, gastritis, duodenitis, pancreatitis, small bowel or large bowel obstruction, abdominal aortic aneurysm, hernia, gastritis, etc.   Complete initial physical exam performed, notably the patient was in no distress, abdomen is not peritoneal.    Reviewed and confirmed nursing documentation for past medical history, family history, social history.  Vital signs reviewed.      Brief summary: 38 year old female with history as above here with epigastric pain, postprandial.  No prior abdominal surgeries. denies alcohol or excessive NSAID use.  No evidence of bleeding.  No jaundice. Upper quadrant ultrasound with out  acute derangement.  UA stable.  Labs stable, her lipase is marginally elevated. Give GI cocktail and reassess  She is feeling much better after intervention.  I am suspicious for possible gastritis or PUD as etiology of her complaints today.  Will start her on PPI and Carafate, bland diet, follow-up GI in 2 weeks  The patient improved significantly and was discharged in stable condition. Detailed discussions were had with the patient/guardian regarding current  findings, and need for close f/u with PCP or on call doctor. The patient/guardian has been instructed to return immediately if the symptoms worsen in any way for re-evaluation. Patient/guardian verbalized understanding and is in agreement with current care plan. All questions answered prior to discharge.               Additional history obtained: -Additional history obtained from na -External records from outside source obtained and reviewed including: Chart review including previous notes, labs, imaging, consultation notes including  Primary care documentation, primary care documentation, prior imaging   Lab Tests: -I ordered, reviewed, and interpreted labs.   The pertinent results include:   Labs Reviewed  LIPASE, BLOOD - Abnormal; Notable for the following components:      Result Value   Lipase 54 (*)    All other components within normal limits  COMPREHENSIVE METABOLIC PANEL - Abnormal; Notable for the following components:   CO2 21 (*)    Glucose, Bld 102 (*)    All other components within normal limits  CBC - Abnormal; Notable for the following components:   WBC 11.1 (*)    Hemoglobin 11.7 (*)    HCT 34.5 (*)    All other components within normal limits  URINALYSIS, ROUTINE W REFLEX MICROSCOPIC - Abnormal; Notable for the following components:   Hgb urine dipstick TRACE (*)    All other components within normal limits  URINALYSIS, MICROSCOPIC (REFLEX) - Abnormal; Notable for the following components:    Bacteria, UA RARE (*)    All other components within normal limits  PREGNANCY, URINE    Notable for lipase mild elev  EKG   EKG Interpretation Date/Time:    Ventricular Rate:    PR Interval:    QRS Duration:    QT Interval:    QTC Calculation:   R Axis:      Text Interpretation:           Imaging Studies ordered: I ordered imaging studies including RUQ [place] I independently visualized the following imaging with scope of interpretation limited to determining acute life threatening conditions related to emergency care; findings noted above I independently visualized and interpreted imaging. I agree with the radiologist interpretation   Medicines ordered and prescription drug management: Meds ordered this encounter  Medications   AND Linked Order Group    alum & mag hydroxide-simeth (MAALOX/MYLANTA) 200-200-20 MG/5ML suspension 30 mL    lidocaine (XYLOCAINE) 2 % viscous mouth solution 15 mL   sodium chloride 0.9 % bolus 1,000 mL   pantoprazole (PROTONIX) 20 MG tablet    Sig: Take 1 tablet (20 mg total) by mouth daily for 14 days.    Dispense:  14 tablet    Refill:  0   sucralfate (CARAFATE) 1 g tablet    Sig: Take 1 tablet (1 g total) by mouth with breakfast, with lunch, and with evening meal for 7 days.    Dispense:  21 tablet    Refill:  0    -I have reviewed the patients home medicines and have made adjustments as needed   Consultations Obtained: na   Cardiac Monitoring: Continuous pulse oximetry interpreted by myself, 100% on RA.    Social Determinants of Health:  Diagnosis or treatment significantly limited by social determinants of health: current smoker and obesity   Reevaluation: After the interventions noted above, I reevaluated the patient and found that they have improved  Co morbidities that complicate the patient evaluation  Past Medical History:  Diagnosis Date   Back pain    Hip pain    Sickle cell trait (HCC)        Dispostion: Disposition decision including need for hospitalization was considered, and patient discharged from emergency department.    Final Clinical Impression(s) / ED Diagnoses Final diagnoses:  Epigastric pain        Sloan Leiter, DO 03/24/23 0013

## 2023-03-23 NOTE — Discharge Instructions (Addendum)
 It was a pleasure caring for you today in the emergency department.  Be sure to eat a bland diet over the next 2 weeks.  Avoid alcohol, tobacco, NSAIDs.  Take medications as prescribed.  Recommend follow-up with gastroenterology in 2 weeks if symptoms persist.   Please return to the emergency department for any worsening or worrisome symptoms.

## 2023-03-23 NOTE — ED Provider Notes (Incomplete)
 [place] [facility] Provider Note  CSN: [id number] Arrival date & time: 03/23/23 1622  Chief Complaint(s) Abdominal Pain  HPI Wendy Weaver is a 38 y.o. female with past medical history as below, significant for back pain, sickle cell trait, migraines who presents to the ED with complaint of epigastric pain postprandial  Symptoms ongoing over the past 10 days.  Postprandial pain to the epigastrium, burning, sharp sensation, occasionally radiates to her right upper quadrant and back.  No nausea or vomiting.  No change in bowel or bladder function, no BRBPR or melena, no light-colored stool.  No fevers, chills, jaundice.  No recent diet or medication changes.  Denies similar symptoms the past.  Symptoms seem worse after eating greasy or fried foods spicy foods  Past Medical History Past Medical History:  Diagnosis Date  . Back pain   . Hip pain   . Sickle cell trait [facility])    Patient Active Problem List   Diagnosis Date Noted  . Hx of migraines 12/09/2021  . Chronic idiopathic urticaria 12/09/2021   Home Medication(s) Prior to Admission medications   Medication Sig Start Date End Date Taking? Authorizing Provider  pantoprazole (PROTONIX) 20 MG tablet Take 1 tablet (20 mg total) by mouth daily for 14 days. 03/23/23 04/06/23 Yes Tanda Rockers A, DO  sucralfate (CARAFATE) 1 g tablet Take 1 tablet (1 g total) by mouth with breakfast, with lunch, and with evening meal for 7 days. 03/23/23 03/30/23 Yes Tanda Rockers A, DO  IBUPROFEN PO Take by mouth as needed.    [provider]                                                                                                                                    Past Surgical History Past Surgical History:  Procedure Laterality Date  . arm surgery Right    right wrist   Family History Family History  Problem Relation Age of Onset  . Diabetes Mother   . Hypertension Mother   . Arthritis Mother    . Hypertension Father   . Hypertension Sister     Social History Social History   Tobacco Use  . Smoking status: Every Day    Current packs/day: 0.50    Average packs/day: 0.5 packs/day for 17.0 years (8.5 ttl pk-yrs)    Types: Cigarettes    Passive exposure: Never  . Smokeless tobacco: Never  Vaping Use  . Vaping status: Never Used  Substance Use Topics  . Alcohol use: Yes    Comment: occ  . Drug use: No   Allergies Patient has no known allergies.  Review of Systems A thorough review of systems was obtained and all systems are negative except as noted in the HPI and PMH.   Physical Exam Vital Signs  I have reviewed the triage vital signs BP 124/79   Pulse 78   Temp 98.1 F (  36.7 C) (Oral)   Resp 15   Ht 5\' 7"  (1.702 m)   Wt 93 kg   LMP 09/14/2022 (Exact Date)   SpO2 100%   BMI 32.11 kg/m  Physical Exam Vitals and nursing note reviewed.  Constitutional:      General: She is not in acute distress.    Appearance: Normal appearance.  HENT:     Head: Normocephalic and atraumatic.     Right Ear: External ear normal.     Left Ear: External ear normal.     Nose: Nose normal.     Mouth/Throat:     Mouth: Mucous membranes are moist.  Eyes:     General: No scleral icterus.       Right eye: No discharge.        Left eye: No discharge.  Cardiovascular:     Rate and Rhythm: Normal rate and regular rhythm.     Pulses: Normal pulses.     Heart sounds: Normal heart sounds.  Pulmonary:     Effort: Pulmonary effort is normal. No respiratory distress.     Breath sounds: Normal breath sounds. No stridor.  Abdominal:     General: Abdomen is flat. There is no distension.     Palpations: Abdomen is soft.     Tenderness: There is abdominal tenderness in the epigastric area.  Musculoskeletal:     Cervical back: No rigidity.     Right lower leg: No edema.     Left lower leg: No edema.  Skin:    General: Skin is warm and dry.     Capillary Refill: Capillary refill  takes less than 2 seconds.  Neurological:     Mental Status: She is alert.  Psychiatric:        Mood and Affect: Mood normal.        Behavior: Behavior normal. Behavior is cooperative.     ED Results and Treatments Labs (all labs ordered are listed, but only abnormal results are displayed) Labs Reviewed  LIPASE, BLOOD - Abnormal; Notable for the following components:      Result Value   Lipase 54 (*)    All other components within normal limits  COMPREHENSIVE METABOLIC PANEL - Abnormal; Notable for the following components:   CO2 21 (*)    Glucose, Bld 102 (*)    All other components within normal limits  CBC - Abnormal; Notable for the following components:   WBC 11.1 (*)    Hemoglobin 11.7 (*)    HCT 34.5 (*)    All other components within normal limits  URINALYSIS, ROUTINE W REFLEX MICROSCOPIC - Abnormal; Notable for the following components:   Hgb urine dipstick TRACE (*)    All other components within normal limits  URINALYSIS, MICROSCOPIC (REFLEX) - Abnormal; Notable for the following components:   Bacteria, UA RARE (*)    All other components within normal limits  PREGNANCY, URINE  Radiology US Abdomen Limited RUQ (LIVER/GB) Result Date: 03/23/2023 CLINICAL DATA:  Right upper quadrant pain EXAM: ULTRASOUND ABDOMEN LIMITED RIGHT UPPER QUADRANT COMPARISON:  None Available. FINDINGS: Gallbladder: No gallstones or wall thickening visualized. No sonographic Murphy sign noted by sonographer. Common bile duct: Diameter: Normal, 3 mm Liver: No focal lesion identified. Within normal limits in parenchymal echogenicity. Portal vein is patent on color Doppler imaging with normal direction of blood flow towards the liver. Other: None. IMPRESSION: Normal right upper quadrant ultrasound. No explanation for patient's symptoms. Electronically Signed   By: Jeronimo Greaves  M.D.   On: 03/23/2023 18:38    Pertinent labs & imaging results that were available during my care of the patient were reviewed by me and considered in my medical decision making (see MDM for details).  Medications Ordered in ED Medications  alum & mag hydroxide-simeth (MAALOX/MYLANTA) 200-200-20 MG/5ML suspension 30 mL (30 mLs Oral Given 03/23/23 2218)    And  lidocaine (XYLOCAINE) 2 % viscous mouth solution 15 mL (15 mLs Oral Given 03/23/23 2218)  sodium chloride 0.9 % bolus 1,000 mL (0 mLs Intravenous Stopped 03/23/23 2325)                                                                                                                                     Procedures Procedures  (including critical care time)  Medical Decision Making / ED Course    Medical Decision Making:    Wendy Weaver is a 38 y.o. female with past medical history as below, significant for back pain, sickle cell trait, migraines who presents to the ED with complaint of epigastric pain postprandial. The complaint involves an extensive differential diagnosis and also carries with it a high risk of complications and morbidity.  Serious etiology was considered. Ddx includes but is not limited to: Differential diagnosis includes but is not exclusive to acute cholecystitis, intrathoracic causes for epigastric abdominal pain, gastritis, duodenitis, pancreatitis, small bowel or large bowel obstruction, abdominal aortic aneurysm, hernia, gastritis, etc.   Complete initial physical exam performed, notably the patient was in no distress, abdomen is not peritoneal.    Reviewed and confirmed nursing documentation for past medical history, family history, social history.  Vital signs reviewed.      Brief summary: 38 year old female with history as above here with epigastric pain, postprandial.  No prior abdominal surgeries. denies alcohol or excessive NSAID use.  No evidence of bleeding.  No jaundice. Upper quadrant  ultrasound with out acute derangement.  UA stable.  Labs stable, her lipase is marginally elevated. Give GI cocktail and reassess   She is feeling much better after intervention.  I am suspicious for possible gastritis or PUD as etiology of her complaints today.  Will start her on PPI and Carafate, bland diet, follow-up GI in 2 weeks  The patient improved significantly and was discharged in stable condition. Detailed discussions were had with the patient/guardian regarding  current findings, and need for close f/u with PCP or on call doctor. The patient/guardian has been instructed to return immediately if the symptoms worsen in any way for re-evaluation. Patient/guardian verbalized understanding and is in agreement with current care plan. All questions answered prior to discharge.               Additional history obtained: -Additional history obtained from na -External records from outside source obtained and reviewed including: Chart review including previous notes, labs, imaging, consultation notes including  Primary care documentation, primary care documentation, prior imaging   Lab Tests: -I ordered, reviewed, and interpreted labs.   The pertinent results include:   Labs Reviewed  LIPASE, BLOOD - Abnormal; Notable for the following components:      Result Value   Lipase 54 (*)    All other components within normal limits  COMPREHENSIVE METABOLIC PANEL - Abnormal; Notable for the following components:   CO2 21 (*)    Glucose, Bld 102 (*)    All other components within normal limits  CBC - Abnormal; Notable for the following components:   WBC 11.1 (*)    Hemoglobin 11.7 (*)    HCT 34.5 (*)    All other components within normal limits  URINALYSIS, ROUTINE W REFLEX MICROSCOPIC - Abnormal; Notable for the following components:   Hgb urine dipstick TRACE (*)    All other components within normal limits  URINALYSIS, MICROSCOPIC (REFLEX) - Abnormal; Notable for the  following components:   Bacteria, UA RARE (*)    All other components within normal limits  PREGNANCY, URINE    Notable for ***  EKG   EKG Interpretation Date/Time:    Ventricular Rate:    PR Interval:    QRS Duration:    QT Interval:    QTC Calculation:   R Axis:      Text Interpretation:           Imaging Studies ordered: I ordered imaging studies including RUQ [place] I independently visualized the following imaging with scope of interpretation limited to determining acute life threatening conditions related to emergency care; findings noted above I independently visualized and interpreted imaging. I agree with the radiologist interpretation   Medicines ordered and prescription drug management: Meds ordered this encounter  Medications  . AND Linked Order Group   . alum & mag hydroxide-simeth (MAALOX/MYLANTA) 200-200-20 MG/5ML suspension 30 mL   . lidocaine (XYLOCAINE) 2 % viscous mouth solution 15 mL  . sodium chloride 0.9 % bolus 1,000 mL  . pantoprazole (PROTONIX) 20 MG tablet    Sig: Take 1 tablet (20 mg total) by mouth daily for 14 days.    Dispense:  14 tablet    Refill:  0  . sucralfate (CARAFATE) 1 g tablet    Sig: Take 1 tablet (1 g total) by mouth with breakfast, with lunch, and with evening meal for 7 days.    Dispense:  21 tablet    Refill:  0    -I have reviewed the patients home medicines and have made adjustments as needed   Consultations Obtained: I requested consultation with the ***,  and discussed lab and imaging findings as well as pertinent plan - they recommend: ***   Cardiac Monitoring: The patient was maintained on a cardiac monitor.  I personally viewed and interpreted the cardiac monitored which showed an underlying rhythm of: *** Continuous pulse oximetry interpreted by myself, 100% on RA.    Social Determinants of Health:  Diagnosis  or treatment significantly limited by social determinants of health: current smoker and  obesity   Reevaluation: After the interventions noted above, I reevaluated the patient and found that they have improved  Co morbidities that complicate the patient evaluation . Past Medical History:  Diagnosis Date  . Back pain   . Hip pain   . Sickle cell trait (HCC)       Dispostion: Disposition decision including need for hospitalization was considered, and patient {[id number]::"discharged from emergency department."}    Final Clinical Impression(s) / ED Diagnoses Final diagnoses:  Epigastric pain

## 2023-03-23 NOTE — ED Triage Notes (Signed)
 Pt states started to have pain in epigastric area,  Pain worse after she eats, radiates to RUQ and into right side of back  Denies N/V  Denies fever

## 2023-04-20 ENCOUNTER — Ambulatory Visit
Admission: EM | Admit: 2023-04-20 | Discharge: 2023-04-20 | Disposition: A | Discharge status: Home / Self Care | Attending: Internal Medicine | Admitting: Internal Medicine

## 2023-04-20 ENCOUNTER — Encounter: Payer: Self-pay | Admitting: Emergency Medicine

## 2023-04-20 DIAGNOSIS — J069 Acute upper respiratory infection, unspecified: Secondary | ICD-10-CM | POA: Insufficient documentation

## 2023-04-20 LAB — POC COVID19/FLU A&B COMBO
Covid Antigen, POC: NEGATIVE
Influenza A Antigen, POC: NEGATIVE
Influenza B Antigen, POC: NEGATIVE

## 2023-04-20 MED ORDER — GUAIFENESIN ER 1200 MG PO TB12: 1200.0000 mg | ORAL_TABLET | Freq: Two times a day (BID) | ORAL | 0 refills | Status: DC

## 2023-04-20 MED ORDER — PROMETHAZINE-DM 6.25-15 MG/5ML PO SYRP: 5.0000 mL | ORAL_SOLUTION | Freq: Every evening | ORAL | 0 refills | Status: DC | PRN

## 2023-04-20 NOTE — ED Provider Notes (Signed)
 [facility]    CSN: [id number] Arrival date & time: 04/20/23  1621      History   Chief Complaint Chief Complaint  Patient presents with   Fever   Cough    HPI Wendy Weaver is a 38 y.o. female.   Wendy [facility] is a 38 y.o. female presenting for chief complaint of fever, chills, cough, congestion, sore throat, and generalized fatigue that started 3 days ago.  Reports recent sick contact with similar symptoms (children).  Cough is productive with yellow/clear sputum.  Denies shortness of breath, chest pain, heart palpitations, dizziness, rash, and N/V.  Denies history of chronic respiratory problems.  Current everyday cigarette smoker, denies other drug use.  Taking over-the-counter medications with minimal relief.   Fever Associated symptoms: cough   Cough Associated symptoms: fever     Past Medical History:  Diagnosis Date   Back pain    Hip pain    Sickle cell trait [facility])     Patient Active Problem List   Diagnosis Date Noted   Hx of migraines 12/09/2021   Chronic idiopathic urticaria 12/09/2021    Past Surgical History:  Procedure Laterality Date   arm surgery Right    right wrist    OB History     Gravida  4   Para  2   Term  2   Preterm  0   AB  1   Living  2      SAB  1   IAB  0   Ectopic  0   Multiple  0   Live Births               Home Medications    Prior to Admission medications   Medication Sig Start Date End Date Taking? Authorizing Provider  Guaifenesin 1200 MG TB12 Take 1 tablet (1,200 mg total) by mouth in the morning and at bedtime. 04/20/23  Yes Carlisle Beers, FNP  promethazine-dextromethorphan (PROMETHAZINE-DM) 6.25-15 MG/5ML syrup Take 5 mLs by mouth at bedtime as needed for cough. 04/20/23  Yes Carlisle Beers, FNP  IBUPROFEN PO Take by mouth as needed.    [provider]  pantoprazole (PROTONIX) 20 MG tablet Take 1 tablet (20 mg total) by mouth daily for 14 days. 03/23/23  04/06/23  Sloan Leiter, DO  sucralfate (CARAFATE) 1 g tablet Take 1 tablet (1 g total) by mouth with breakfast, with lunch, and with evening meal for 7 days. 03/23/23 03/30/23  Sloan Leiter, DO    Family History Family History  Problem Relation Age of Onset   Diabetes Mother    Hypertension Mother    Arthritis Mother    Hypertension Father    Hypertension Sister     Social History Social History   Tobacco Use   Smoking status: Every Day    Current packs/day: 0.50    Average packs/day: 0.5 packs/day for 17.0 years (8.5 ttl pk-yrs)    Types: Cigarettes    Passive exposure: Never   Smokeless tobacco: Never  Vaping Use   Vaping status: Never Used  Substance Use Topics   Alcohol use: Yes    Comment: occ   Drug use: No     Allergies   Patient has no known allergies.   Review of Systems Review of Systems  Constitutional:  Positive for fever.  Respiratory:  Positive for cough.   Per HPI   Physical Exam Triage Vital Signs ED Triage Vitals  Encounter Vitals Group  BP 04/20/23 1713 133/86     Systolic BP Percentile --      Diastolic BP Percentile --      Pulse Rate 04/20/23 1713 99     Resp 04/20/23 1713 16     Temp 04/20/23 1713 99.5 F (37.5 C)     Temp src --      SpO2 04/20/23 1713 98 %     Weight --      Height --      Head Circumference --      Peak Flow --      Pain Score 04/20/23 1717 5     Pain Loc --      Pain Education --      Exclude from Growth Chart --    No data found.  Updated Vital Signs BP 133/86 (BP Location: Right Arm)   Pulse 99   Temp 99.5 F (37.5 C)   Resp 16   SpO2 98%   Visual Acuity Right Eye Distance:   Left Eye Distance:   Bilateral Distance:    Right Eye Near:   Left Eye Near:    Bilateral Near:     Physical Exam Vitals and nursing note reviewed.  Constitutional:      Appearance: She is ill-appearing. She is not toxic-appearing.  HENT:     Head: Normocephalic and atraumatic.     Right Ear: Hearing,  tympanic membrane, ear canal and external ear normal.     Left Ear: Hearing, tympanic membrane, ear canal and external ear normal.     Nose: Congestion present.     Mouth/Throat:     Lips: Pink.     Mouth: Mucous membranes are moist. No injury or oral lesions.     Dentition: Normal dentition.     Tongue: No lesions.     Pharynx: Oropharynx is clear. Uvula midline. No pharyngeal swelling, oropharyngeal exudate, posterior oropharyngeal erythema, uvula swelling or postnasal drip.     Tonsils: No tonsillar exudate.  Eyes:     General: Lids are normal. Vision grossly intact. Gaze aligned appropriately.     Extraocular Movements: Extraocular movements intact.     Conjunctiva/sclera: Conjunctivae normal.  Neck:     Trachea: Trachea and phonation normal.  Cardiovascular:     Rate and Rhythm: Normal rate and regular rhythm.     Heart sounds: Normal heart sounds, S1 normal and S2 normal.  Pulmonary:     Effort: Pulmonary effort is normal. No respiratory distress.     Breath sounds: Normal breath sounds and air entry. No wheezing, rhonchi or rales.  Chest:     Chest wall: No tenderness.  Musculoskeletal:     Cervical back: Neck supple.  Lymphadenopathy:     Cervical: No cervical adenopathy.  Skin:    General: Skin is warm and dry.     Capillary Refill: Capillary refill takes less than 2 seconds.     Findings: No rash.  Neurological:     General: No focal deficit present.     Mental Status: She is alert and oriented to person, place, and time. Mental status is at baseline.     Cranial Nerves: No dysarthria or facial asymmetry.  Psychiatric:        Mood and Affect: Mood normal.        Speech: Speech normal.        Behavior: Behavior normal.        Thought Content: Thought content normal.  Judgment: Judgment normal.      UC Treatments / Results  Labs (all labs ordered are listed, but only abnormal results are displayed) Labs Reviewed  SARS CORONAVIRUS 2 (TAT 6-24 HRS)  POC  COVID19/FLU A&B COMBO    EKG   Radiology No results found.  Procedures Procedures (including critical care time)  Medications Ordered in UC Medications - No data to display  Initial Impression / Assessment and Plan / UC Course  I have reviewed the triage vital signs and the nursing notes.  Pertinent labs & imaging results that were available during my care of the patient were reviewed by me and considered in my medical decision making (see chart for details).   1. Viral URI with cough Suspect viral URI, viral syndrome.  Strep/viral testing: POC COVID and flu testing are negative, PCR COVID testing pending.  Physical exam findings reassuring, vital signs hemodynamically stable, and lungs clear, therefore deferred imaging of the chest.  Advised supportive care/prescriptions for symptomatic relief as outlined in AVS.    Counseled patient on potential for adverse effects with medications prescribed/recommended today, strict ER and return-to-clinic precautions discussed, patient verbalized understanding.    Final Clinical Impressions(s) / UC Diagnoses   Final diagnoses:  Viral URI with cough     Discharge Instructions      You have a viral illness which will improve on its own with rest, fluids, and medications to help with your symptoms.  Tylenol, guaifenesin (plain mucinex), and saline nasal sprays may help relieve symptoms.   Two teaspoons of honey in 1 cup of warm water every 4-6 hours may help with throat pains.  Humidifier in room at nighttime may help soothe cough (clean well daily).   For chest pain, shortness of breath, inability to keep food or fluids down without vomiting, fever that does not respond to tylenol or motrin, or any other severe symptoms, please go to the ER for further evaluation. Return to urgent care as needed, otherwise follow-up with PCP.      ED Prescriptions     Medication Sig Dispense Auth. Provider   promethazine-dextromethorphan  (PROMETHAZINE-DM) 6.25-15 MG/5ML syrup Take 5 mLs by mouth at bedtime as needed for cough. 118 mL Reita May M, FNP   Guaifenesin 1200 MG TB12 Take 1 tablet (1,200 mg total) by mouth in the morning and at bedtime. 14 tablet Carlisle Beers, FNP      PDMP not reviewed this encounter.   Carlisle Beers, [place] 04/20/23 1753

## 2023-04-20 NOTE — ED Triage Notes (Signed)
 Pt c/o cough, congestion, fever chest discomfort since Tuesday.

## 2023-04-20 NOTE — Discharge Instructions (Addendum)

## 2023-04-21 LAB — SARS CORONAVIRUS 2 (TAT 6-24 HRS): SARS Coronavirus 2: NEGATIVE

## 2023-05-29 DIAGNOSIS — Z3042 Encounter for surveillance of injectable contraceptive: Secondary | ICD-10-CM | POA: Diagnosis not present

## 2023-06-06 ENCOUNTER — Other Ambulatory Visit: Payer: Self-pay

## 2023-06-06 ENCOUNTER — Ambulatory Visit
Admission: EM | Admit: 2023-06-06 | Discharge: 2023-06-06 | Disposition: A | Discharge status: Home / Self Care | Attending: Emergency Medicine | Admitting: Emergency Medicine

## 2023-06-06 DIAGNOSIS — H9201 Otalgia, right ear: Secondary | ICD-10-CM | POA: Diagnosis not present

## 2023-06-06 DIAGNOSIS — H6991 Unspecified Eustachian tube disorder, right ear: Secondary | ICD-10-CM

## 2023-06-06 MED ORDER — GUAIFENESIN ER 600 MG PO TB12: 600.0000 mg | ORAL_TABLET | Freq: Two times a day (BID) | ORAL | 0 refills | Status: AC

## 2023-06-06 MED ORDER — PSEUDOEPHEDRINE HCL ER 120 MG PO TB12: 120.0000 mg | ORAL_TABLET | Freq: Two times a day (BID) | ORAL | 0 refills | Status: DC

## 2023-06-06 MED ORDER — FLUTICASONE PROPIONATE 50 MCG/ACT NA SUSP: 1.0000 | Freq: Every day | NASAL | 0 refills | Status: DC

## 2023-06-06 NOTE — ED Provider Notes (Signed)
 [facility]    CSN: [id number] Arrival date & time: 06/06/23  1631      History   Chief Complaint Chief Complaint  Patient presents with   Otalgia    HPI Shale Mcgann is a 38 y.o. female.   Patient presents to clinic for concerns of right ear pain.  This has been an ongoing and intermittent issue for the past few months.  Initially she did a telehealth visit was prescribed amoxicillin, this did not really help.  Also tried Sudafed, Flonase  and Mucinex  without much relief.  She was advised to follow-up with her dentist, which she did.  They did some scans and did not see any abscesses or infections, due to her symptoms the dentist put her on a different antibiotic, which helped.  Patient feels some pressure in her ear and that her ear will pop and click when she opens her jaw.  Feels like there is something in her right ear.  Has not had any drainage.  Does have pain that got much worse today.  She has had postnasal drip and sinus congestion with an intermittent headache.  Does take a daily antihistamine.  Has not had any fevers.    The history is provided by medical records and the patient.  Otalgia   Past Medical History:  Diagnosis Date   Back pain    Hip pain    Sickle cell trait [facility])     Patient Active Problem List   Diagnosis Date Noted   Hx of migraines 12/09/2021   Chronic idiopathic urticaria 12/09/2021    Past Surgical History:  Procedure Laterality Date   arm surgery Right    right wrist    OB History     Gravida  4   Para  2   Term  2   Preterm  0   AB  1   Living  2      SAB  1   IAB  0   Ectopic  0   Multiple  0   Live Births               Home Medications    Prior to Admission medications   Medication Sig Start Date End Date Taking? Authorizing Provider  fluticasone  (FLONASE ) 50 MCG/ACT nasal spray Place 1 spray into both nostrils daily. 06/06/23  Yes Khoury Siemon  N, FNP  guaiFENesin  (MUCINEX ) 600  MG 12 hr tablet Take 1 tablet (600 mg total) by mouth 2 (two) times daily. 06/06/23  Yes Neyla Gauntt  N, FNP  pseudoephedrine (SUDAFED SINUS CONGESTION 12HR) 120 MG 12 hr tablet Take 1 tablet (120 mg total) by mouth 2 (two) times daily. 06/06/23  Yes Senaya Dicenso  N, FNP  IBUPROFEN  PO Take by mouth as needed.    [provider]  pantoprazole  (PROTONIX ) 20 MG tablet Take 1 tablet (20 mg total) by mouth daily for 14 days. 03/23/23 04/06/23  Teddi Favors, DO  promethazine -dextromethorphan (PROMETHAZINE -DM) 6.25-15 MG/5ML syrup Take 5 mLs by mouth at bedtime as needed for cough. 04/20/23   Starlene Eaton, FNP  sucralfate  (CARAFATE ) 1 g tablet Take 1 tablet (1 g total) by mouth with breakfast, with lunch, and with evening meal for 7 days. 03/23/23 03/30/23  Teddi Favors, DO    Family History Family History  Problem Relation Age of Onset   Diabetes Mother    Hypertension Mother    Arthritis Mother    Hypertension Father    Hypertension Sister  Social History Social History   Tobacco Use   Smoking status: Every Day    Current packs/day: 0.50    Average packs/day: 0.5 packs/day for 17.0 years (8.5 ttl pk-yrs)    Types: Cigarettes    Passive exposure: Never   Smokeless tobacco: Never  Vaping Use   Vaping status: Never Used  Substance Use Topics   Alcohol use: Yes    Comment: occ   Drug use: No     Allergies   Patient has no known allergies.   Review of Systems Review of Systems  Per HPI  Physical Exam Triage Vital Signs ED Triage Vitals  Encounter Vitals Group     BP 06/06/23 1641 (!) 142/80     Systolic BP Percentile --      Diastolic BP Percentile --      Pulse Rate 06/06/23 1641 99     Resp 06/06/23 1641 20     Temp 06/06/23 1641 98.5 F (36.9 C)     Temp Source 06/06/23 1641 Oral     SpO2 06/06/23 1641 98 %     Weight --      Height --      Head Circumference --      Peak Flow --      Pain Score 06/06/23 1640 5     Pain Loc --       Pain Education --      Exclude from Growth Chart --    No data found.  Updated Vital Signs BP (!) 142/80 (BP Location: Right Arm)   Pulse 99   Temp 98.5 F (36.9 C) (Oral)   Resp 20   SpO2 98%   Visual Acuity Right Eye Distance:   Left Eye Distance:   Bilateral Distance:    Right Eye Near:   Left Eye Near:    Bilateral Near:     Physical Exam Vitals and nursing note reviewed.  Constitutional:      Appearance: Normal appearance.  HENT:     Head: Normocephalic and atraumatic.     Right Ear: Ear canal and external ear normal. A middle ear effusion is present.     Left Ear: Tympanic membrane, ear canal and external ear normal.     Nose: Congestion and rhinorrhea present.     Mouth/Throat:     Mouth: Mucous membranes are moist.     Pharynx: Posterior oropharyngeal erythema present.  Eyes:     Conjunctiva/sclera: Conjunctivae normal.  Cardiovascular:     Rate and Rhythm: Normal rate.  Pulmonary:     Effort: Pulmonary effort is normal. No respiratory distress.  Skin:    General: Skin is warm and dry.  Neurological:     General: No focal deficit present.     Mental Status: She is alert.  Psychiatric:        Mood and Affect: Mood normal.        Behavior: Behavior is cooperative.      UC Treatments / Results  Labs (all labs ordered are listed, but only abnormal results are displayed) Labs Reviewed - No data to display  EKG   Radiology No results found.  Procedures Procedures (including critical care time)  Medications Ordered in UC Medications - No data to display  Initial Impression / Assessment and Plan / UC Course  I have reviewed the triage vital signs and the nursing notes.  Pertinent labs & imaging results that were available during my care of the patient were reviewed by  me and considered in my medical decision making (see chart for details).  Vitals in triage reviewed,.  Right tympanic membrane with middle ear effusion, no obvious signs of  bacterial ideology.  Tympanic membrane is not erythematous or bulging.  Intact.  Suspect middle ear effusion due to congestion, contributing to eustachian tube dysfunction.  Advised decongestions and symptomatic management.  ENT follow-up if symptoms persist.  Plan of care, follow-up care return precautions given, no questions at this time.     Final Clinical Impressions(s) / UC Diagnoses   Final diagnoses:  Otalgia of right ear  Eustachian tube dysfunction, right     Discharge Instructions      Take the Sudafed daily to help with congestion as well as the Flonase  and Mucinex .  Ensure you are drinking at least 64 ounces of water daily.  Sleep with a humidifier may help.  You can consider doing over-the-counter saline nasal rinses with filtered water such as Nettie pot.  This can help manually declog the sinuses.  If this issue persists, please follow-up with ENT for further advanced evaluation.  Return to clinic for any new or urgent symptoms.  ED Prescriptions     Medication Sig Dispense Auth. Provider   pseudoephedrine (SUDAFED SINUS CONGESTION 12HR) 120 MG 12 hr tablet Take 1 tablet (120 mg total) by mouth 2 (two) times daily. 60 tablet Harlow Lighter, Karle Desrosier  N, FNP   fluticasone  (FLONASE ) 50 MCG/ACT nasal spray Place 1 spray into both nostrils daily. 9.9 mL Harlow Lighter, Algenis Ballin  N, FNP   guaiFENesin  (MUCINEX ) 600 MG 12 hr tablet Take 1 tablet (600 mg total) by mouth 2 (two) times daily. 30 tablet Harlow Lighter, Jaimi Belle  N, FNP      PDMP not reviewed this encounter.   Harlow Lighter, Zayvier Caravello  N, FNP 06/06/23 1706

## 2023-06-06 NOTE — ED Triage Notes (Signed)
 Pt has been dealing w/ RT ear pain x 3 mths. Pt states that she went to urgent care was given amoxicillin and went to the dentist. Dentist gave different abx and pt said it worked. Pt states she hears a popping noise and feels like something is trying to drain in her ear. Pt c/o pain. Pt c/o post nasal drip and HA as well. Pt says she's dealing with a cold 3-4 weeks ago.

## 2023-06-06 NOTE — Discharge Instructions (Signed)
 Take the Sudafed daily to help with congestion as well as the Flonase  and Mucinex .  Ensure you are drinking at least 64 ounces of water daily.  Sleep with a humidifier may help.  You can consider doing over-the-counter saline nasal rinses with filtered water such as Nettie pot.  This can help manually declog the sinuses.  If this issue persists, please follow-up with ENT for further advanced evaluation.  Return to clinic for any new or urgent symptoms.

## 2023-06-20 NOTE — Progress Notes (Deleted)
   Office Visit Note  Patient: [facility]             Date of Birth: 1985-10-13           MRN: [id number]             PCP: Hampton Levins, PA-C Referring: Francois Isaacs* Visit Date: 07/03/2023 Occupation: @GUAROCC @  Subjective:  No chief complaint on file.   History of Present Illness: Wendy [facility] is a 38 y.o. female ***     Activities of Daily Living:  Patient reports morning stiffness for *** {minute/hour:19697}.   Patient {ACTIONS;DENIES/REPORTS:21021675::"Denies"} nocturnal pain.  Difficulty dressing/grooming: {ACTIONS;DENIES/REPORTS:21021675::"Denies"} Difficulty climbing stairs: {ACTIONS;DENIES/REPORTS:21021675::"Denies"} Difficulty getting out of chair: {ACTIONS;DENIES/REPORTS:21021675::"Denies"} Difficulty using hands for taps, buttons, cutlery, and/or writing: {ACTIONS;DENIES/REPORTS:21021675::"Denies"}  No Rheumatology ROS completed.   PMFS History:  Patient Active Problem List   Diagnosis Date Noted   Hx of migraines 12/09/2021   Chronic idiopathic urticaria 12/09/2021    Past Medical History:  Diagnosis Date   Back pain    Hip pain    Sickle cell trait (HCC)     Family History  Problem Relation Age of Onset   Diabetes Mother    Hypertension Mother    Arthritis Mother    Hypertension Father    Hypertension Sister    Past Surgical History:  Procedure Laterality Date   arm surgery Right    right wrist   Social History   Social History Narrative   Not on file   Immunization History  Administered Date(s) Administered   PFIZER(Purple Top)SARS-COV-2 Vaccination 05/09/2019, 06/02/2019   Tdap 09/04/2021     Objective: Vital Signs: There were no vitals taken for this visit.   Physical Exam   Musculoskeletal Exam: ***  CDAI Exam: CDAI Score: -- Patient Global: --; Provider Global: -- Swollen: --; Tender: -- Joint Exam 07/03/2023   No joint exam has been documented for this visit   There is currently no  information documented on the homunculus. Go to the Rheumatology activity and complete the homunculus joint exam.  Investigation: No additional findings.  Imaging: No results found.  Recent Labs: Lab Results  Component Value Date   WBC 11.1 (H) 03/23/2023   HGB 11.7 (L) 03/23/2023   PLT 345 03/23/2023   NA 138 03/23/2023   K 3.6 03/23/2023   CL 107 03/23/2023   CO2 21 (L) 03/23/2023   GLUCOSE 102 (H) 03/23/2023   BUN 10 03/23/2023   CREATININE 0.67 03/23/2023   BILITOT 0.5 03/23/2023   ALKPHOS 44 03/23/2023   AST 18 03/23/2023   ALT 17 03/23/2023   PROT 7.5 03/23/2023   ALBUMIN 3.9 03/23/2023   CALCIUM 8.9 03/23/2023    Speciality Comments: No specialty comments available.  Procedures:  No procedures performed Allergies: Patient has no known allergies.   Assessment / Plan:     Visit Diagnoses: No diagnosis found.  Orders: No orders of the defined types were placed in this encounter.  No orders of the defined types were placed in this encounter.   Face-to-face time spent with patient was *** minutes. Greater than 50% of time was spent in counseling and coordination of care.  Follow-Up Instructions: No follow-ups on file.   Dee Farber, CMA  Note - This record has been created using Animal nutritionist.  Chart creation errors have been sought, but may not always  have been located. Such creation errors do not reflect on  the standard of medical care.

## 2023-06-26 ENCOUNTER — Encounter (INDEPENDENT_AMBULATORY_CARE_PROVIDER_SITE_OTHER): Payer: Self-pay | Admitting: Physician Assistant

## 2023-06-26 ENCOUNTER — Ambulatory Visit (INDEPENDENT_AMBULATORY_CARE_PROVIDER_SITE_OTHER): Admitting: Physician Assistant

## 2023-06-26 VITALS — BP 165/90 | HR 86 | Ht 67.0 in | Wt 192.0 lb

## 2023-06-26 DIAGNOSIS — H9209 Otalgia, unspecified ear: Secondary | ICD-10-CM

## 2023-06-26 DIAGNOSIS — H6991 Unspecified Eustachian tube disorder, right ear: Secondary | ICD-10-CM

## 2023-06-26 MED ORDER — FLUTICASONE PROPIONATE 50 MCG/ACT NA SUSP: 2.0000 | Freq: Every day | NASAL | 6 refills | Status: AC

## 2023-06-26 NOTE — Progress Notes (Signed)
 Dear Dr. Esta Heidelberg, Here is my assessment for our mutual patient, Wendy Weaver. Thank you for allowing me the opportunity to care for your patient. Please do not hesitate to contact me should you have any other questions. Sincerely, Belma Boxer PA-C  Otolaryngology Clinic Note Referring provider: Dr. Esta Heidelberg HPI:  Wendy Weaver is a 38 y.o. female kindly referred by Dr. Esta Heidelberg   The patient is a 38 year old female seen in our office for evaluation of right ear pain.  Patient notes approximately 2 months ago she started to develop pressure and pain in the right ear.  She notes some associated muffled hearing, she also reports that she did feel slightly off balance at that time.  She denies any ringing in the ear.  She did have clicking and popping.  She notes that she went to urgent care, she was started on Flonase .  She notes this did not improve her symptoms.  She returned and was given oral antibiotics and referral to a dentist.  The dentist did not note any dental issues but reported fluid behind her right ear.  She notes that the antibiotics eventually improved her symptoms.  She had another upper respiratory infection and developed pain in the right ear, she went back to urgent care, they saw fluid again.  She was placed on decongestant pills.  She notes that today she is feeling much better, she has no pain in the right ear, she does feel like its slightly off.  She did have a muffled sensation in the right ear which has improved.  She notes a history of seasonal allergies and takes a daily antihistamine currently taking Claritin.  She reports postnasal drainage.  She reports using Flonase  for approximately 1 week with no symptomatic improvement so she discontinued it.  She denies any preceding history with 3 years including recurrent ear infections, head or neck surgery.  She does report she is an active smoker.   Independent Review of Additional Tests or Records:   none   PMH/Meds/All/SocHx/FamHx/ROS:   Past Medical History:  Diagnosis Date   Back pain    Hip pain    Sickle cell trait (HCC)      Past Surgical History:  Procedure Laterality Date   arm surgery Right    right wrist    Family History  Problem Relation Age of Onset   Diabetes Mother    Hypertension Mother    Arthritis Mother    Hypertension Father    Hypertension Sister      Social Connections: Not on file      Current Outpatient Medications:    fluticasone  (FLONASE ) 50 MCG/ACT nasal spray, Place 1 spray into both nostrils daily., Disp: 9.9 mL, Rfl: 0   guaiFENesin  (MUCINEX ) 600 MG 12 hr tablet, Take 1 tablet (600 mg total) by mouth 2 (two) times daily., Disp: 30 tablet, Rfl: 0   IBUPROFEN  PO, Take by mouth as needed., Disp: , Rfl:    promethazine -dextromethorphan (PROMETHAZINE -DM) 6.25-15 MG/5ML syrup, Take 5 mLs by mouth at bedtime as needed for cough., Disp: 118 mL, Rfl: 0   pseudoephedrine  (SUDAFED SINUS CONGESTION 12HR) 120 MG 12 hr tablet, Take 1 tablet (120 mg total) by mouth 2 (two) times daily., Disp: 60 tablet, Rfl: 0   pantoprazole  (PROTONIX ) 20 MG tablet, Take 1 tablet (20 mg total) by mouth daily for 14 days., Disp: 14 tablet, Rfl: 0   sucralfate  (CARAFATE ) 1 g tablet, Take 1 tablet (1 g total) by mouth with breakfast, with lunch,  and with evening meal for 7 days., Disp: 21 tablet, Rfl: 0   Physical Exam:   BP (!) 165/90   Pulse 86   Ht 5\' 7"  (1.702 m)   Wt 192 lb (87.1 kg)   SpO2 97%   BMI 30.07 kg/m   Pertinent Findings  CN II-XII intact Bilateral EAC clear and TM intact with well pneumatized middle ear spaces Weber 512: equal  Rinne 512: AC > BC b/l  Anterior rhinoscopy: Septum midline; bilateral inferior turbinates with moderate hypertrophy and edema No lesions of oral cavity/oropharynx; dentition wnl No obviously palpable neck masses/lymphadenopathy/thyromegaly No respiratory distress or stridor  Seprately Identifiable Procedures:   None  Impression & Plans:  Wendy Weaver is a 38 y.o. female with the following   Right ear pain-  The patient symptoms are most consistent with eustachian tube dysfunction.  Today she is doing well with no significant symptoms.  I do not appreciate any fluid within the middle ear space.  Given that she has had recurring episodes and intermittent reported hearing loss of the right ear I would recommend audiological evaluation.  Once these results are available I will discuss them with her on the phone.  In the meantime I would like her to start Flonase , continue daily antihistamine spray.  I counseled her on smoking cessation.  She does have unilateral effusion on audiological evaluation she will need nasal endoscopy.  The patient verbalized understanding and agreement to today's plan had no further questions or concerns.   - f/u phone call discussion with audiological results   Thank you for allowing me the opportunity to care for your patient. Please do not hesitate to contact me should you have any other questions.  Sincerely, Belma Boxer PA-C [facility] Phone: [phone] Fax: [phone]  06/26/2023, 8:58 AM

## 2023-07-03 ENCOUNTER — Ambulatory Visit: Payer: BC Managed Care – PPO | Admitting: Rheumatology

## 2023-07-03 DIAGNOSIS — G8929 Other chronic pain: Secondary | ICD-10-CM

## 2023-07-03 DIAGNOSIS — M79641 Pain in right hand: Secondary | ICD-10-CM

## 2023-07-03 DIAGNOSIS — R700 Elevated erythrocyte sedimentation rate: Secondary | ICD-10-CM

## 2023-07-03 DIAGNOSIS — Z8669 Personal history of other diseases of the nervous system and sense organs: Secondary | ICD-10-CM

## 2023-07-03 DIAGNOSIS — L501 Idiopathic urticaria: Secondary | ICD-10-CM

## 2023-07-03 DIAGNOSIS — M7122 Synovial cyst of popliteal space [Baker], left knee: Secondary | ICD-10-CM

## 2023-07-03 DIAGNOSIS — F17200 Nicotine dependence, unspecified, uncomplicated: Secondary | ICD-10-CM

## 2023-07-03 DIAGNOSIS — R5383 Other fatigue: Secondary | ICD-10-CM

## 2023-07-03 DIAGNOSIS — R768 Other specified abnormal immunological findings in serum: Secondary | ICD-10-CM

## 2023-07-24 ENCOUNTER — Ambulatory Visit (INDEPENDENT_AMBULATORY_CARE_PROVIDER_SITE_OTHER): Admitting: Audiology

## 2023-07-24 DIAGNOSIS — Z0110 Encounter for examination of ears and hearing without abnormal findings: Secondary | ICD-10-CM | POA: Diagnosis not present

## 2023-07-24 DIAGNOSIS — H93291 Other abnormal auditory perceptions, right ear: Secondary | ICD-10-CM

## 2023-07-24 NOTE — Progress Notes (Signed)
  [address] [place], [place] [address] [id number]  Audiological Evaluation    Name: Wendy Weaver     DOB:   [birth year]      MRN:   [id number]                                                                                     Service Date: 07/24/2023     Accompanied by: unaccompanied   Patient comes today after Reyes Cohen, PA-C sent a referral for a hearing evaluation due to concerns with ear fullness.   Symptoms Yes Details  Hearing loss  []    Tinnitus  []    Ear pain/ infections/pressure  [x]  Right ear - mainly when she starts to feel sick  Balance problems  []  Felt some imbalance at some point  Noise exposure history  []    Previous ear surgeries  []    Family history of hearing loss  []    Amplification  []    Other  []      Otoscopy: Right ear: Clear external ear canal and notable landmarks visualized on the tympanic membrane. Left ear:  Clear external ear canal and notable landmarks visualized on the tympanic membrane.  Tympanometry: Right ear: Type A- Normal external ear canal volume with normal middle ear pressure and tympanic membrane compliance. Left ear: Type A- Normal external ear canal volume with normal middle ear pressure and tympanic membrane compliance.   Pure tone Audiometry: Normal hearing from [phone] Hz, in both ears  Speech Audiometry: Right ear- Speech Reception Threshold (SRT) was obtained at 10 dBHL. Left ear-Speech Reception Threshold (SRT) was obtained at 10 dBHL.   Word Recognition Score Tested using NU-6 (recorded) Right ear: 100% was obtained at a presentation level of 55 dBHL with contralateral masking which is deemed as  excellent. Left ear: 100% was obtained at a presentation level of 55 dBHL with contralateral masking which is deemed as  excellent.   The hearing test results were completed under headphones and results are deemed to be of good reliability. Test technique:  conventional     Recommendations: Follow  up with ENT . Return for a hearing evaluation if concerns with hearing changes arise or per MD recommendation.   Sequoyah Counterman MARIE LEROUX-MARTINEZ, AUD

## 2023-07-25 ENCOUNTER — Telehealth (INDEPENDENT_AMBULATORY_CARE_PROVIDER_SITE_OTHER): Payer: Self-pay | Admitting: Physician Assistant

## 2023-07-25 NOTE — Telephone Encounter (Signed)
 I spoke with Wendy Weaver about her audiological evaluation.  Normal audiological evaluation.  She reports her symptoms have resolved and is doing well.  She may follow-up on a as needed basis for this.  She also had questions about a persistent ear piercing hole that would not close, am happy to see her in the office for evaluation of this.  The patient was happy with today's plan had no further questions or concerns.

## 2023-07-31 ENCOUNTER — Encounter: Payer: Self-pay | Admitting: Audiology

## 2023-08-18 ENCOUNTER — Ambulatory Visit
Admission: EM | Admit: 2023-08-18 | Discharge: 2023-08-18 | Disposition: A | Discharge status: Home / Self Care | Attending: Physician Assistant | Admitting: Physician Assistant

## 2023-08-18 ENCOUNTER — Other Ambulatory Visit: Payer: Self-pay

## 2023-08-18 DIAGNOSIS — M10071 Idiopathic gout, right ankle and foot: Secondary | ICD-10-CM | POA: Diagnosis not present

## 2023-08-18 MED ORDER — COLCHICINE 0.6 MG PO TABS: 0.6000 mg | ORAL_TABLET | Freq: Every day | ORAL | 0 refills | Status: AC

## 2023-08-18 NOTE — ED Provider Notes (Signed)
 [facility]    CSN: [id number] Arrival date & time: 08/18/23  1232      History   Chief Complaint Chief Complaint  Patient presents with   Toe Pain    HPI Wendy Weaver is a 38 y.o. female.   HPI  Pt states she woke up on Friday with right third toe swelling and pain  She reports she cannot bend the toe due to pain  She states she was able to make it through the work day and tried soaking her foot but this did not provide relief She reports the pain and swelling have continued today  She denies injuries or trauma to the area prior to the pain starting  She states today she noticed a black line across the skin  Interventions: ibuprofen   This did not provide much relief yesterday  She denies toenail pain and did not see any changes to her toenail when she got her pedicure on Thurs   Past Medical History:  Diagnosis Date   Back pain    Hip pain    Sickle cell trait [facility])     Patient Active Problem List   Diagnosis Date Noted   Hx of migraines 12/09/2021   Chronic idiopathic urticaria 12/09/2021    Past Surgical History:  Procedure Laterality Date   arm surgery Right    right wrist    OB History     Gravida  4   Para  2   Term  2   Preterm  0   AB  1   Living  2      SAB  1   IAB  0   Ectopic  0   Multiple  0   Live Births               Home Medications    Prior to Admission medications   Medication Sig Start Date End Date Taking? Authorizing Provider  fluticasone  (FLONASE ) 50 MCG/ACT nasal spray Place 2 sprays into both nostrils daily. 06/26/23   Hedges, Reyes, PA-C  guaiFENesin  (MUCINEX ) 600 MG 12 hr tablet Take 1 tablet (600 mg total) by mouth 2 (two) times daily. 06/06/23   Dreama, [place]  N, FNP  IBUPROFEN  PO Take by mouth as needed.    [provider]  pantoprazole  (PROTONIX ) 20 MG tablet Take 1 tablet (20 mg total) by mouth daily for 14 days. 03/23/23 04/06/23  Elnor Jayson LABOR, DO   promethazine -dextromethorphan (PROMETHAZINE -DM) 6.25-15 MG/5ML syrup Take 5 mLs by mouth at bedtime as needed for cough. 04/20/23   Enedelia Dorna HERO, FNP  pseudoephedrine  (SUDAFED SINUS CONGESTION 12HR) 120 MG 12 hr tablet Take 1 tablet (120 mg total) by mouth 2 (two) times daily. 06/06/23   Dreama, [place]  N, FNP  sucralfate  (CARAFATE ) 1 g tablet Take 1 tablet (1 g total) by mouth with breakfast, with lunch, and with evening meal for 7 days. 03/23/23 03/30/23  Elnor Jayson LABOR, DO    Family History Family History  Problem Relation Age of Onset   Diabetes Mother    Hypertension Mother    Arthritis Mother    Hypertension Father    Hypertension Sister     Social History Social History   Tobacco Use   Smoking status: Every Day    Current packs/day: 0.50    Average packs/day: 0.5 packs/day for 17.0 years (8.5 ttl pk-yrs)    Types: Cigarettes    Passive exposure: Never   Smokeless tobacco: Never  Vaping Use  Vaping status: Never Used  Substance Use Topics   Alcohol use: Yes    Comment: occ   Drug use: No     Allergies   Patient has no known allergies.   Review of Systems Review of Systems  Musculoskeletal:        Right third toe pain and swelling       Physical Exam Triage Vital Signs ED Triage Vitals  Encounter Vitals Group     BP 08/18/23 1304 118/75     Girls Systolic BP Percentile --      Girls Diastolic BP Percentile --      Boys Systolic BP Percentile --      Boys Diastolic BP Percentile --      Pulse Rate 08/18/23 1304 89     Resp 08/18/23 1304 18     Temp 08/18/23 1304 98.2 F (36.8 C)     Temp Source 08/18/23 1304 Oral     SpO2 08/18/23 1304 97 %     Weight 08/18/23 1307 200 lb (90.7 kg)     Height 08/18/23 1307 5' 9 (1.753 m)     Head Circumference --      Peak Flow --      Pain Score 08/18/23 1306 5     Pain Loc --      Pain Education --      Exclude from Growth Chart --    No data found.  Updated Vital Signs BP 118/75 (BP Location:  Right Arm)   Pulse 89   Temp 98.2 F (36.8 C) (Oral)   Resp 18   Ht 5' 9 (1.753 m)   Wt 200 lb (90.7 kg)   LMP 07/31/2023 (Exact Date)   SpO2 97%   BMI 29.53 kg/m   Visual Acuity Right Eye Distance:   Left Eye Distance:   Bilateral Distance:    Right Eye Near:   Left Eye Near:    Bilateral Near:     Physical Exam Vitals reviewed.  Constitutional:      General: She is awake.     Appearance: Normal appearance. She is well-developed and well-groomed.  HENT:     Head: Normocephalic and atraumatic.  Eyes:     General: Lids are normal. Gaze aligned appropriately.     Extraocular Movements: Extraocular movements intact.     Conjunctiva/sclera: Conjunctivae normal.  Pulmonary:     Effort: Pulmonary effort is normal.  Neurological:     Mental Status: She is alert and oriented to person, place, and time.  Psychiatric:        Attention and Perception: Attention and perception normal.        Mood and Affect: Mood and affect normal.        Speech: Speech normal.        Behavior: Behavior normal. Behavior is cooperative.      UC Treatments / Results  Labs (all labs ordered are listed, but only abnormal results are displayed) Labs Reviewed - No data to display  EKG   Radiology No results found.  Procedures Procedures (including critical care time)  Medications Ordered in UC Medications - No data to display  Initial Impression / Assessment and Plan / UC Course  I have reviewed the triage vital signs and the nursing notes.  Pertinent labs & imaging results that were available during my care of the patient were reviewed by me and considered in my medical decision making (see chart for details).     ***  Final Clinical Impressions(s) / UC Diagnoses   Final diagnoses:  None   Discharge Instructions   None    ED Prescriptions   None    PDMP not reviewed this encounter.

## 2023-08-18 NOTE — ED Triage Notes (Addendum)
 Pt presents with complaints of toe pain (third toe on the right side) that began yesterday 7/25 upon wakening. No known injuries. Currently rates overall pain a 5/10. Pain increases with ambulation. Describes pain as radiating and throbbing. OTC Ibuprofen  taken, rubbing alcohol applied, and soaked in epsom salt with no improvement. Pt states there is a black ring on the toe. I got a pedicure on Thursday night.

## 2023-08-18 NOTE — Discharge Instructions (Addendum)
 You were seen today for concerns of right toe pain Your symptoms appear consistent with gout. To help with this I have sent in a script for an antiinflammatory medication called Colchicine  for you to take once per day until your pain is resolved- usually about 5-10 days.  Please stay well hydrated with plenty of water and review the information I have included in your paperwork If your pain is not improving, seems to be worsening, there is increased swelling or the symptoms seem to be spreading to other toes- please return to urgent care for further evaluation.

## 2023-08-21 DIAGNOSIS — Z3042 Encounter for surveillance of injectable contraceptive: Secondary | ICD-10-CM | POA: Diagnosis not present

## 2023-10-26 DIAGNOSIS — Z6832 Body mass index (BMI) 32.0-32.9, adult: Secondary | ICD-10-CM | POA: Diagnosis not present

## 2023-10-26 DIAGNOSIS — Z0000 Encounter for general adult medical examination without abnormal findings: Secondary | ICD-10-CM | POA: Diagnosis not present

## 2023-10-26 DIAGNOSIS — E6609 Other obesity due to excess calories: Secondary | ICD-10-CM | POA: Diagnosis not present

## 2023-10-26 DIAGNOSIS — R7303 Prediabetes: Secondary | ICD-10-CM | POA: Diagnosis not present

## 2023-10-26 DIAGNOSIS — M5442 Lumbago with sciatica, left side: Secondary | ICD-10-CM | POA: Diagnosis not present

## 2023-10-26 DIAGNOSIS — J40 Bronchitis, not specified as acute or chronic: Secondary | ICD-10-CM | POA: Diagnosis not present

## 2023-10-26 DIAGNOSIS — E66811 Obesity, class 1: Secondary | ICD-10-CM | POA: Diagnosis not present

## 2023-10-26 DIAGNOSIS — M5441 Lumbago with sciatica, right side: Secondary | ICD-10-CM | POA: Diagnosis not present

## 2023-10-26 DIAGNOSIS — R053 Chronic cough: Secondary | ICD-10-CM | POA: Diagnosis not present

## 2023-10-26 DIAGNOSIS — G8929 Other chronic pain: Secondary | ICD-10-CM | POA: Diagnosis not present

## 2023-10-26 DIAGNOSIS — F17210 Nicotine dependence, cigarettes, uncomplicated: Secondary | ICD-10-CM | POA: Diagnosis not present

## 2023-10-26 DIAGNOSIS — Z13220 Encounter for screening for lipoid disorders: Secondary | ICD-10-CM | POA: Diagnosis not present

## 2023-11-13 ENCOUNTER — Ambulatory Visit
Admission: RE | Admit: 2023-11-13 | Discharge: 2023-11-13 | Disposition: A | Discharge status: Home / Self Care | Source: Ambulatory Visit | Attending: Emergency Medicine | Admitting: Emergency Medicine

## 2023-11-13 ENCOUNTER — Ambulatory Visit: Admitting: Radiology

## 2023-11-13 VITALS — BP 125/83 | HR 88 | Temp 98.4°F | Resp 16

## 2023-11-13 DIAGNOSIS — R059 Cough, unspecified: Secondary | ICD-10-CM | POA: Diagnosis not present

## 2023-11-13 DIAGNOSIS — R918 Other nonspecific abnormal finding of lung field: Secondary | ICD-10-CM | POA: Diagnosis not present

## 2023-11-13 DIAGNOSIS — H9203 Otalgia, bilateral: Secondary | ICD-10-CM | POA: Diagnosis not present

## 2023-11-13 DIAGNOSIS — F17200 Nicotine dependence, unspecified, uncomplicated: Secondary | ICD-10-CM | POA: Diagnosis not present

## 2023-11-13 DIAGNOSIS — R051 Acute cough: Secondary | ICD-10-CM

## 2023-11-13 NOTE — ED Provider Notes (Signed)
 [facility]    CSN: [id number] Arrival date & time: 11/13/23  1612      History   Chief Complaint Chief Complaint  Patient presents with   Otalgia    HPI Wendy Weaver is a 38 y.o. female.   38 year old female, Wendy Weaver, presents to urgent care for evaluation of ear pain, cough, scratchy throat,lung/shoulder pain x 1 year intermittently.  Patient states she was seen and prescribed antibiotic on 10/26/23 and still has symptoms.  Patient endorses smoking cigarettes   Patient reports she has seen ENT and pulmonology and has a pulmonology follow-up appointment next week.  Patient states was previously told she had reflux and has been taking medicine in the past, but does not believe that is the cause.  Patient requesting chest x-ray  The history is provided by the patient. No language interpreter was used.    Past Medical History:  Diagnosis Date   Back pain    Hip pain    Sickle cell trait     Patient Active Problem List   Diagnosis Date Noted   Acute cough 11/13/2023   Smoker 11/13/2023   Ear pain, bilateral 11/13/2023   Hx of migraines 12/09/2021   Chronic idiopathic urticaria 12/09/2021    Past Surgical History:  Procedure Laterality Date   arm surgery Right    right wrist    OB History     Gravida  4   Para  2   Term  2   Preterm  0   AB  1   Living  2      SAB  1   IAB  0   Ectopic  0   Multiple  0   Live Births               Home Medications    Prior to Admission medications   Medication Sig Start Date End Date Taking? Authorizing Provider  colchicine  0.6 MG tablet Take 1 tablet (0.6 mg total) by mouth daily. 08/18/23   Mecum, Erin E, PA-C  fluticasone  (FLONASE ) 50 MCG/ACT nasal spray Place 2 sprays into both nostrils daily. 06/26/23   Hedges, Reyes, PA-C  guaiFENesin  (MUCINEX ) 600 MG 12 hr tablet Take 1 tablet (600 mg total) by mouth 2 (two) times daily. 06/06/23   Dreama, [place]  N, FNP  IBUPROFEN  PO Take  by mouth as needed.    [provider]  pantoprazole  (PROTONIX ) 20 MG tablet Take 1 tablet (20 mg total) by mouth daily for 14 days. 03/23/23 04/06/23  Elnor Jayson LABOR, DO  promethazine -dextromethorphan (PROMETHAZINE -DM) 6.25-15 MG/5ML syrup Take 5 mLs by mouth at bedtime as needed for cough. 04/20/23   Enedelia Dorna HERO, FNP  pseudoephedrine  (SUDAFED SINUS CONGESTION 12HR) 120 MG 12 hr tablet Take 1 tablet (120 mg total) by mouth 2 (two) times daily. 06/06/23   Dreama, [place]  N, FNP  sucralfate  (CARAFATE ) 1 g tablet Take 1 tablet (1 g total) by mouth with breakfast, with lunch, and with evening meal for 7 days. 03/23/23 03/30/23  Elnor Jayson LABOR, DO    Family History Family History  Problem Relation Age of Onset   Diabetes Mother    Hypertension Mother    Arthritis Mother    Hypertension Father    Hypertension Sister     Social History Social History   Tobacco Use   Smoking status: Every Day    Current packs/day: 0.50    Average packs/day: 0.5 packs/day for 17.0 years (8.5 ttl pk-yrs)  Types: Cigarettes    Passive exposure: Never   Smokeless tobacco: Never  Vaping Use   Vaping status: Never Used  Substance Use Topics   Alcohol use: Yes    Comment: occ   Drug use: No     Allergies   Patient has no known allergies.   Review of Systems Review of Systems  Constitutional:  Negative for fever.  HENT:  Positive for ear pain and sore throat.   Respiratory:  Positive for cough. Negative for shortness of breath, wheezing and stridor.   Musculoskeletal:  Positive for myalgias.  Skin: Negative.   All other systems reviewed and are negative.    Physical Exam Triage Vital Signs ED Triage Vitals  Encounter Vitals Group     BP      Girls Systolic BP Percentile      Girls Diastolic BP Percentile      Boys Systolic BP Percentile      Boys Diastolic BP Percentile      Pulse      Resp      Temp      Temp src      SpO2      Weight      Height      Head  Circumference      Peak Flow      Pain Score      Pain Loc      Pain Education      Exclude from Growth Chart    No data found.  Updated Vital Signs BP 125/83 (BP Location: Right Arm)   Pulse 88   Temp 98.4 F (36.9 C) (Oral)   Resp 16   SpO2 95%   Visual Acuity Right Eye Distance:   Left Eye Distance:   Bilateral Distance:    Right Eye Near:   Left Eye Near:    Bilateral Near:     Physical Exam Vitals and nursing note reviewed.  Constitutional:      General: She is not in acute distress.    Appearance: She is well-developed.  HENT:     Head: Normocephalic.     Right Ear: Tympanic membrane is retracted.     Left Ear: Tympanic membrane is retracted.     Nose: Congestion present.     Mouth/Throat:     Lips: Pink.     Mouth: Mucous membranes are moist.     Pharynx: Oropharynx is clear.  Eyes:     General: Lids are normal.     Conjunctiva/sclera: Conjunctivae normal.     Pupils: Pupils are equal, round, and reactive to light.  Neck:     Trachea: No tracheal deviation.  Cardiovascular:     Rate and Rhythm: Normal rate and regular rhythm.     Heart sounds: Normal heart sounds. No murmur heard. Pulmonary:     Effort: Pulmonary effort is normal.     Breath sounds: Normal breath sounds and air entry.  Abdominal:     General: Bowel sounds are normal.     Palpations: Abdomen is soft.     Tenderness: There is no abdominal tenderness.  Musculoskeletal:        General: Normal range of motion.     Cervical back: Normal range of motion.  Lymphadenopathy:     Cervical: No cervical adenopathy.  Skin:    General: Skin is warm and dry.     Findings: No rash.  Neurological:     General: No focal deficit present.  Mental Status: She is alert and oriented to person, place, and time.     GCS: GCS eye subscore is 4. GCS verbal subscore is 5. GCS motor subscore is 6.  Psychiatric:        Speech: Speech normal.        Behavior: Behavior normal. Behavior is cooperative.       UC Treatments / Results  Labs (all labs ordered are listed, but only abnormal results are displayed) Labs Reviewed - No data to display  EKG   Radiology DG Chest 2 View Result Date: 11/13/2023 CLINICAL DATA:  Cough EXAM: DG CHEST 2V COMPARISON:  Chest x-ray 08/31/2015 FINDINGS: The cardiac silhouette, mediastinal and hilar contours are within normal limits. There is peribronchial thickening and increased interstitial markings which could reflect changes of smoking or bronchitis. No pulmonary infiltrates or pleural effusions. No pneumothorax. No pulmonary lesions. The bony thorax is intact. IMPRESSION: 1. Peribronchial thickening and increased interstitial markings could reflect changes of smoking or bronchitis. 2. No infiltrates or effusions. Electronically Signed   By: MYRTIS Stammer M.D.   On: 11/13/2023 18:05    Procedures Procedures (including critical care time)  Medications Ordered in UC Medications - No data to display  Initial Impression / Assessment and Plan / UC Course  I have reviewed the triage vital signs and the nursing notes.  Pertinent labs & imaging results that were available during my care of the patient were reviewed by me and considered in my medical decision making (see chart for details).  Clinical Course as of 11/13/23 1817  Tue Nov 13, 2023  1759 Wet read, negative for acute findings, discussed with pt, check my chart for official radiology results. [JD]    Clinical Course User Index [JD] Everley Evora, Rilla, NP   Discussed exam findings and plan of care with patient, strict go to ER precautions given.   Patient verbalized understanding to this provider.  Ddx: Cough (acute on chronic ), ear pain, smoker, GERD, allergies Final Clinical Impressions(s) / UC Diagnoses   Final diagnoses:  Acute cough  Smoker  Ear pain, bilateral     Discharge Instructions      Check MyChart for chest x-ray results Stop smoking, avoid spicy greasy fried  foods, avoid alcohol, take reflux medicine as previously prescribed Follow-up with PCP, pulmonologist as scheduled; if you develop chest pain shortness of breath or palpitations go to the emergency room for further evaluation     ED Prescriptions   None    PDMP not reviewed this encounter.   Aminta Rilla, NP 11/13/23 [phone]

## 2023-11-13 NOTE — ED Triage Notes (Signed)
 Pt states she has chronic cough and ear pain that comes and goes for over a year.   Pt c/o scratchy throat, bilateral ear pain, shoulder pain upper back pain, cough. States symptoms began about 4 weeks ago  She was at PCP on 10/3 and given abx

## 2023-11-13 NOTE — Discharge Instructions (Addendum)
 Check MyChart for chest x-ray results Stop smoking, avoid spicy greasy fried foods, avoid alcohol, take reflux medicine as previously prescribed Follow-up with PCP, pulmonologist as scheduled; if you develop chest pain shortness of breath or palpitations go to the emergency room for further evaluation

## 2023-11-14 ENCOUNTER — Ambulatory Visit (HOSPITAL_COMMUNITY): Payer: Self-pay

## 2023-11-14 ENCOUNTER — Telehealth: Payer: Self-pay

## 2023-11-14 NOTE — Telephone Encounter (Signed)
 This RN returned patient's phone call at this time. Name and DOB verified. Pt called wanting to know chest x-ray results from yesterday's visit. This RN read Ulanda Punches PA note (in chart at 11:59 AM 10/22) verbatim to patient. States no change in treatment. Continue with instructions that were given at discharge. No further questions. Pt verbalized understanding.

## 2023-11-15 DIAGNOSIS — Z3042 Encounter for surveillance of injectable contraceptive: Secondary | ICD-10-CM | POA: Diagnosis not present

## 2023-11-26 DIAGNOSIS — R053 Chronic cough: Secondary | ICD-10-CM | POA: Diagnosis not present

## 2023-11-26 DIAGNOSIS — E119 Type 2 diabetes mellitus without complications: Secondary | ICD-10-CM | POA: Diagnosis not present

## 2023-12-22 ENCOUNTER — Other Ambulatory Visit (HOSPITAL_BASED_OUTPATIENT_CLINIC_OR_DEPARTMENT_OTHER): Payer: Self-pay

## 2023-12-22 MED ORDER — OZEMPIC (0.25 OR 0.5 MG/DOSE) 2 MG/3ML ~~LOC~~ SOPN
0.5000 mg | PEN_INJECTOR | SUBCUTANEOUS | 5 refills | Status: DC
  Filled 2023-12-22 – 2024-02-13 (×6): qty 3, 28d supply, fill #0

## 2023-12-24 ENCOUNTER — Other Ambulatory Visit (HOSPITAL_COMMUNITY): Payer: Self-pay

## 2023-12-24 ENCOUNTER — Other Ambulatory Visit (HOSPITAL_BASED_OUTPATIENT_CLINIC_OR_DEPARTMENT_OTHER): Payer: Self-pay

## 2023-12-24 ENCOUNTER — Other Ambulatory Visit: Payer: Self-pay

## 2023-12-24 MED ORDER — IMIQUIMOD 5 % EX CREA
TOPICAL_CREAM | CUTANEOUS | 1 refills | Status: AC
  Filled 2023-12-24 (×3): qty 8, 12d supply, fill #0

## 2023-12-24 MED ORDER — METHOCARBAMOL 500 MG PO TABS
500.0000 mg | ORAL_TABLET | Freq: Every evening | ORAL | 2 refills | Status: AC
  Filled 2023-12-24 – 2024-01-29 (×2): qty 30, 30d supply, fill #0

## 2023-12-24 MED ORDER — FREESTYLE LANCETS MISC
5 refills | Status: AC
  Filled 2023-12-24: qty 100, 34d supply, fill #0
  Filled 2023-12-26: qty 100, 30d supply, fill #0
  Filled 2024-01-29: qty 100, 33d supply, fill #0

## 2023-12-24 MED ORDER — ACCU-CHEK GUIDE W/DEVICE KIT
PACK | 0 refills | Status: AC
  Filled 2023-12-24: qty 1, 90d supply, fill #0
  Filled 2023-12-26: qty 1, 30d supply, fill #0

## 2023-12-24 MED ORDER — GLUCOSE BLOOD VI STRP
ORAL_STRIP | 5 refills | Status: AC
  Filled 2023-12-24: qty 100, 30d supply, fill #0
  Filled 2023-12-24: qty 100, 34d supply, fill #0
  Filled 2023-12-26: qty 100, 33d supply, fill #0
  Filled 2023-12-27 (×2): qty 100, 30d supply, fill #0
  Filled 2024-01-29: qty 100, 30d supply, fill #1

## 2023-12-24 MED ORDER — ACCU-CHEK GUIDE TEST VI STRP: ORAL_STRIP | 5 refills | Status: AC

## 2023-12-24 MED ORDER — OZEMPIC (0.25 OR 0.5 MG/DOSE) 2 MG/3ML ~~LOC~~ SOPN
0.5000 mg | PEN_INJECTOR | SUBCUTANEOUS | 0 refills | Status: DC
  Filled 2023-12-26: qty 3, fill #0
  Filled 2024-01-19: qty 3, 28d supply, fill #0

## 2023-12-24 MED ORDER — OZEMPIC (0.25 OR 0.5 MG/DOSE) 2 MG/3ML ~~LOC~~ SOPN
0.2500 mg | PEN_INJECTOR | SUBCUTANEOUS | 0 refills | Status: DC
  Filled 2023-12-24 – 2023-12-26 (×6): qty 3, 28d supply, fill #0
  Filled 2023-12-26 – 2024-02-12 (×2): qty 3, fill #0

## 2023-12-25 ENCOUNTER — Other Ambulatory Visit (HOSPITAL_COMMUNITY): Payer: Self-pay

## 2023-12-26 ENCOUNTER — Other Ambulatory Visit: Payer: Self-pay

## 2023-12-26 ENCOUNTER — Other Ambulatory Visit (HOSPITAL_COMMUNITY): Payer: Self-pay

## 2023-12-27 ENCOUNTER — Other Ambulatory Visit (HOSPITAL_BASED_OUTPATIENT_CLINIC_OR_DEPARTMENT_OTHER): Payer: Self-pay

## 2023-12-27 ENCOUNTER — Other Ambulatory Visit (HOSPITAL_COMMUNITY): Payer: Self-pay

## 2024-01-19 ENCOUNTER — Other Ambulatory Visit: Payer: Self-pay

## 2024-01-20 ENCOUNTER — Other Ambulatory Visit (HOSPITAL_COMMUNITY): Payer: Self-pay

## 2024-01-21 ENCOUNTER — Other Ambulatory Visit: Payer: Self-pay

## 2024-01-29 ENCOUNTER — Other Ambulatory Visit (HOSPITAL_BASED_OUTPATIENT_CLINIC_OR_DEPARTMENT_OTHER): Payer: Self-pay

## 2024-01-29 ENCOUNTER — Other Ambulatory Visit (HOSPITAL_COMMUNITY): Payer: Self-pay

## 2024-01-29 MED ORDER — METHOCARBAMOL 500 MG PO TABS
500.0000 mg | ORAL_TABLET | Freq: Every evening | ORAL | 2 refills | Status: AC | PRN
  Filled 2024-01-29: qty 30, 30d supply, fill #0

## 2024-01-30 ENCOUNTER — Other Ambulatory Visit: Payer: Self-pay

## 2024-02-11 ENCOUNTER — Other Ambulatory Visit (HOSPITAL_BASED_OUTPATIENT_CLINIC_OR_DEPARTMENT_OTHER): Payer: Self-pay

## 2024-02-12 ENCOUNTER — Encounter (HOSPITAL_BASED_OUTPATIENT_CLINIC_OR_DEPARTMENT_OTHER): Payer: Self-pay

## 2024-02-12 ENCOUNTER — Other Ambulatory Visit (HOSPITAL_BASED_OUTPATIENT_CLINIC_OR_DEPARTMENT_OTHER): Payer: Self-pay

## 2024-02-12 ENCOUNTER — Other Ambulatory Visit: Payer: Self-pay

## 2024-02-12 MED ORDER — TRETINOIN 0.025 % EX CREA
TOPICAL_CREAM | CUTANEOUS | 11 refills | Status: AC
  Filled 2024-02-12 (×2): qty 20, 30d supply, fill #0

## 2024-02-13 ENCOUNTER — Other Ambulatory Visit (HOSPITAL_BASED_OUTPATIENT_CLINIC_OR_DEPARTMENT_OTHER): Payer: Self-pay

## 2024-02-13 ENCOUNTER — Other Ambulatory Visit: Payer: Self-pay

## 2024-02-13 NOTE — Progress Notes (Signed)
 "  Office Visit Note  Patient: Wendy Weaver             Date of Birth: 12/29/1985           MRN: [id number]             PCP: Evangelina Tinnie Norris, PA-C Referring: Evangelina Tinnie Norris, PA-C Visit Date: 02/27/2024 Occupation: Data Unavailable  Subjective:  Pain in joints  History of Present Illness: Wendy Weaver is a 39 y.o. female with polyarthralgia.  She returns today after her last visit in June 2024.  She continues to have pain and stiffness in her hands, knee joints and SI joints.  She continues to have lower back pain.  She reports the worst pain in her left index finger PIP joint.  She notices pain behind her both knees especially in the right knee.  She has been going to the gym on a regular basis.  There is no history of oral ulcers, nasal ulcers, malar rash, photosensitivity, lymphadenopathy.    Activities of Daily Living:  Patient reports morning stiffness for all day.  Patient Reports nocturnal pain.  Difficulty dressing/grooming: Denies Difficulty climbing stairs: Reports Difficulty getting out of chair: Reports Difficulty using hands for taps, buttons, cutlery, and/or writing: Reports  Review of Systems  Constitutional:  Positive for fatigue.  HENT:  Negative for mouth sores and mouth dryness.   Eyes:  Negative for dryness.  Respiratory:  Negative for shortness of breath.   Cardiovascular:  Negative for chest pain and palpitations.  Gastrointestinal:  Positive for constipation. Negative for blood in stool and diarrhea.  Endocrine: Negative for increased urination.  Genitourinary:  Negative for involuntary urination.  Musculoskeletal:  Positive for joint pain, joint pain, joint swelling, myalgias, morning stiffness and myalgias. Negative for gait problem, muscle weakness and muscle tenderness.  Skin:  Positive for hair loss. Negative for color change, rash and sensitivity to sunlight.  Allergic/Immunologic: Positive for susceptible to infections.   Neurological:  Positive for dizziness and light-headedness. Negative for headaches.  Hematological:  Negative for swollen glands.  Psychiatric/Behavioral:  Positive for sleep disturbance. Negative for depressed mood. The patient is not nervous/anxious.     PMFS History:  Patient Active Problem List   Diagnosis Date Noted   Acute cough 11/13/2023   Smoker 11/13/2023   Ear pain, bilateral 11/13/2023   Hx of migraines 12/09/2021   Chronic idiopathic urticaria 12/09/2021    Past Medical History:  Diagnosis Date   Back pain    Hip pain    Sickle cell trait     Family History  Problem Relation Age of Onset   Diabetes Mother    Hypertension Mother    Arthritis Mother    Hypertension Father    Hypertension Sister    Past Surgical History:  Procedure Laterality Date   arm surgery Right    right wrist   Social History[1] Social History   Social History Narrative   Not on file     Immunization History  Administered Date(s) Administered   PFIZER(Purple Top)SARS-COV-2 Vaccination 05/09/2019, 06/02/2019   Tdap 09/04/2021     Objective: Vital Signs: BP 116/76   Pulse (!) 105   Temp 98.2 F (36.8 C)   Resp 14   Ht 5' 7 (1.702 m)   Wt 189 lb 9.6 oz (86 kg)   BMI 29.70 kg/m    Physical Exam Vitals and nursing note reviewed.  Constitutional:      Appearance: She is well-developed.  HENT:  Head: Normocephalic and atraumatic.  Eyes:     Conjunctiva/sclera: Conjunctivae normal.  Cardiovascular:     Rate and Rhythm: Normal rate and regular rhythm.     Heart sounds: Normal heart sounds.  Pulmonary:     Effort: Pulmonary effort is normal.     Breath sounds: Normal breath sounds.  Abdominal:     General: Bowel sounds are normal.     Palpations: Abdomen is soft.  Musculoskeletal:     Cervical back: Normal range of motion.  Lymphadenopathy:     Cervical: No cervical adenopathy.  Skin:    General: Skin is warm and dry.     Capillary Refill: Capillary refill  takes less than 2 seconds.  Neurological:     Mental Status: She is alert and oriented to person, place, and time.  Psychiatric:        Behavior: Behavior normal.      Musculoskeletal Exam: Cervical, thoracic and lumbar spine were in good range of motion.  There was no SI joint tenderness.  Shoulder joints, elbow joints, wrist joints, MCPs, PIPs and DIPs were in good range of motion with no synovitis.  Mucinous cyst was noted over the right third PIP joint.  Thickening of the left second PIP joint was noted.  Hip joints and knee joints were in good range of motion without any warmth swelling or effusion.  There was no tenderness over ankles or MTPs.   CDAI Exam: CDAI Score: -- Patient Global: --; Provider Global: -- Swollen: --; Tender: -- Joint Exam 02/27/2024   No joint exam has been documented for this visit   There is currently no information documented on the homunculus. Go to the Rheumatology activity and complete the homunculus joint exam.  Investigation: No additional findings.  Imaging: XR KNEE 3 VIEW LEFT Result Date: 02/27/2024 No medial or lateral compartment narrowing was noted.  No patellofemoral narrowing was noted.  No chondrocalcinosis was noted. Impression: Unremarkable x-rays of the knee.  XR KNEE 3 VIEW RIGHT Result Date: 02/27/2024 No medial or lateral compartment narrowing was noted.  No patellofemoral narrowing was noted.  No chondrocalcinosis was noted. Impression: Unremarkable x-rays of the knee.  XR Hand 2 View Left Result Date: 02/27/2024 CMC, PIP and DIP narrowing was noted.  No MCP, intercarpal or radiocarpal joint space narrowing was noted.  No erosive changes were noted. 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  XR Hand 2 View Right Result Date: 02/27/2024 CMC, PIP and DIP narrowing was noted.  No MCP, intercarpal or radiocarpal joint space narrowing was noted.  No erosive changes were noted.  Hardware was noted in the ulna.  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   Recent Labs: Lab Results  Component Value Date   WBC 11.1 (H) 03/23/2023   HGB 11.7 (L) 03/23/2023   PLT 345 03/23/2023   NA 138 03/23/2023   K 3.6 03/23/2023   CL 107 03/23/2023   CO2 21 (L) 03/23/2023   GLUCOSE 102 (H) 03/23/2023   BUN 10 03/23/2023   CREATININE 0.67 03/23/2023   BILITOT 0.5 03/23/2023   ALKPHOS 44 03/23/2023   AST 18 03/23/2023   ALT 17 03/23/2023   PROT 7.5 03/23/2023   ALBUMIN 3.9 03/23/2023   CALCIUM 8.9 03/23/2023    Speciality Comments: No specialty comments available.  Procedures:  No procedures performed Allergies: Patient has no known allergies.   Assessment / Plan:     Visit Diagnoses: Pain in both hands -  patient complains of pain and stiffness in the bilateral hands.  Bilateral PIP and DIP thickening was noted.  No synovitis was noted.  She has been experiencing increased pain in her left second PIP joint.  Plan: XR Hand 2 View Right, XR Hand 2 View Left.  X-rays of bilateral hands were suggestive of osteoarthritis.  X-ray findings were reviewed with the patient.  A handout on hand exercises was given.  Digital mucinous cyst-a digital mucinous cyst was noted over the right third PIP joint.  Chronic pain of both knees -she complains of increased pain and discomfort in her bilateral knee joints.  She describes discomfort in the popliteal region.  No Baker's cyst was noted today.  She had Baker's cyst in the past without any recurrence.- Plan: XR KNEE 3 VIEW RIGHT, XR KNEE 3 VIEW LEFT.  X-rays obtained of bilateral knee joints were unremarkable.  X-ray findings were discussed with the patient.  A handout on knee exercises was given.  Baker's cyst of knee, left - History of Baker's cyst in 2018 with no recurrence per patient.  Chronic SI joint pain -she continues to have discomfort in SI joints.  X-rays from December 09, 2021 of the pelvis showed normal SI joints and hip  joints.  Chronic right-sided low back pain without sciatica -she has intermittent lower back pain.  She states she goes to the gym and exercises on a regular basis.  X-rays were unremarkable in September 2023 which were read by radiologist.  Other medical problems are listed as follows:  Chronic idiopathic urticaria-  Positive ANA (antinuclear antibody) - ANA is low titer positive and not significant.  She has no clinical features of lupus or related disease.  Hx of migraines  Other fatigue  Smoker - half a pack per day for 15 years.  Orders: Orders Placed This Encounter  Procedures   XR Hand 2 View Right   XR Hand 2 View Left   XR KNEE 3 VIEW RIGHT   XR KNEE 3 VIEW LEFT   No orders of the defined types were placed in this encounter.    Follow-Up Instructions: Return if symptoms worsen or fail to improve, for Osteoarthritis.   Maya Nash, MD  Note - This record has been created using [employer].  Chart creation errors have been sought, but may not always  have been located. Such creation errors do not reflect on  the standard of medical care.     [1]  Social History Tobacco Use   Smoking status: Every Day    Current packs/day: 0.50    Average packs/day: 0.5 packs/day for 17.0 years (8.5 ttl pk-yrs)    Types: Cigarettes    Passive exposure: Never   Smokeless tobacco: Never  Vaping Use   Vaping status: Never Used  Substance Use Topics   Alcohol use: Yes    Comment: occ   Drug use: No   "

## 2024-02-17 ENCOUNTER — Other Ambulatory Visit (HOSPITAL_COMMUNITY): Payer: Self-pay

## 2024-02-18 ENCOUNTER — Other Ambulatory Visit (HOSPITAL_COMMUNITY): Payer: Self-pay

## 2024-02-18 MED ORDER — OZEMPIC (0.25 OR 0.5 MG/DOSE) 2 MG/3ML ~~LOC~~ SOPN
0.5000 mg | PEN_INJECTOR | SUBCUTANEOUS | 3 refills | Status: AC
  Filled 2024-02-18: qty 3, 28d supply, fill #0

## 2024-02-19 ENCOUNTER — Other Ambulatory Visit (HOSPITAL_COMMUNITY): Payer: Self-pay

## 2024-02-27 ENCOUNTER — Ambulatory Visit: Admitting: Rheumatology

## 2024-02-27 ENCOUNTER — Ambulatory Visit

## 2024-02-27 ENCOUNTER — Encounter: Payer: Self-pay | Admitting: Rheumatology

## 2024-02-27 VITALS — BP 116/76 | HR 105 | Temp 98.2°F | Resp 14 | Ht 67.0 in | Wt 189.6 lb

## 2024-02-27 DIAGNOSIS — Z8669 Personal history of other diseases of the nervous system and sense organs: Secondary | ICD-10-CM

## 2024-02-27 DIAGNOSIS — G8929 Other chronic pain: Secondary | ICD-10-CM

## 2024-02-27 DIAGNOSIS — M79642 Pain in left hand: Secondary | ICD-10-CM

## 2024-02-27 DIAGNOSIS — M25562 Pain in left knee: Secondary | ICD-10-CM

## 2024-02-27 DIAGNOSIS — M7122 Synovial cyst of popliteal space [Baker], left knee: Secondary | ICD-10-CM | POA: Diagnosis not present

## 2024-02-27 DIAGNOSIS — F17200 Nicotine dependence, unspecified, uncomplicated: Secondary | ICD-10-CM | POA: Diagnosis not present

## 2024-02-27 DIAGNOSIS — M25561 Pain in right knee: Secondary | ICD-10-CM | POA: Diagnosis not present

## 2024-02-27 DIAGNOSIS — M545 Low back pain, unspecified: Secondary | ICD-10-CM

## 2024-02-27 DIAGNOSIS — R5383 Other fatigue: Secondary | ICD-10-CM | POA: Diagnosis not present

## 2024-02-27 DIAGNOSIS — R7689 Other specified abnormal immunological findings in serum: Secondary | ICD-10-CM | POA: Diagnosis not present

## 2024-02-27 DIAGNOSIS — M67449 Ganglion, unspecified hand: Secondary | ICD-10-CM

## 2024-02-27 DIAGNOSIS — M533 Sacrococcygeal disorders, not elsewhere classified: Secondary | ICD-10-CM | POA: Diagnosis not present

## 2024-02-27 DIAGNOSIS — R700 Elevated erythrocyte sedimentation rate: Secondary | ICD-10-CM

## 2024-02-27 DIAGNOSIS — L501 Idiopathic urticaria: Secondary | ICD-10-CM | POA: Diagnosis not present

## 2024-02-27 DIAGNOSIS — M79641 Pain in right hand: Secondary | ICD-10-CM

## 2024-02-27 NOTE — Patient Instructions (Signed)
 Exercises for Chronic Knee Pain Chronic knee pain is pain that lasts longer than 3 months. For most people with chronic knee pain, exercise and weight loss is an important part of treatment. Your health care provider may want you to focus on: Making the muscles that support your knee stronger. This can take pressure off your knee and reduce pain. Preventing knee stiffness. How far you can move your knee, keeping it there or making it farther. Losing weight (if this applies) to take pressure off your knee, lower your risk for injury, and make it easier for you to exercise. Your provider will help you make an exercise program that fits your needs and physical abilities. Below are simple, low-impact exercises you can do at home. Ask your provider or physical therapist how often you should do your exercise program and how many times to repeat each exercise. General safety tips  Get your provider's approval before doing any exercises. Start slowly and stop any time you feel pain. Do not exercise if your knee pain is flaring up. Warm up first. Stretching a cold muscle can cause an injury. Do 5-10 minutes of easy movement or light stretching before beginning your exercises. Do 5-10 minutes of low-impact activity (like walking or cycling) before starting strengthening exercises. Contact your provider any time you have pain during or after exercising. Exercise can cause discomfort but should not be painful. It is normal to be a little stiff or sore after exercising. Stretching and range-of-motion exercises Front thigh stretch  Stand up straight and support your body by holding on to a chair or resting one hand on a wall. With your legs straight and close together, bend one knee to lift your heel up toward your butt. Using one hand for support, grab your ankle with your free hand. Pull your foot up closer toward your butt to feel the stretch in front of your thigh. Hold the stretch for 30  seconds. Repeat __________ times. Complete this exercise __________ times a day. Back thigh stretch  Sit on the floor with your back straight and your legs out straight in front of you. Place the palms of your hands on the floor and slide them toward your feet as you bend at the hip. Try to touch your nose to your knees and feel the stretch in the back of your thighs. Hold for 30 seconds. Repeat __________ times. Complete this exercise __________ times a day. Calf stretch  Stand facing a wall. Place the palms of your hands flat against the wall, arms extended, and lean slightly against the wall. Get into a lunge position with one leg bent at the knee and the other leg stretched out straight behind you. Keep both feet facing the wall and increase the bend in your knee while keeping the heel of the other leg flat on the ground. You should feel the stretch in your calf. Hold for 30 seconds. Repeat __________ times. Complete this exercise __________ times a day. Strengthening exercises Straight leg lift  Lie on your back with one knee bent and the other leg out straight. Slowly lift the straight leg without bending the knee. Lift until your foot is about 12 inches (30 cm) off the floor. Hold for 3-5 seconds and slowly lower your leg. Repeat __________ times. Complete this exercise __________ times a day. Single leg dip  Stand between two chairs and put both hands on the backs of the chairs for support. Extend one leg out straight with your body  weight resting on the heel of the standing leg. Slowly bend your standing knee to dip your body to the level that is comfortable for you. Hold for 3-5 seconds. Repeat __________ times. Complete this exercise __________ times a day. Hamstring curls  Stand straight, knees close together, facing the back of a chair. Hold on to the back of a chair with both hands. Keep one leg straight. Bend the other knee while bringing the heel up toward the butt  until the knee is bent at a 90-degree angle (right angle). Hold for 3-5 seconds. Repeat __________ times. Complete this exercise __________ times a day. Wall squat  Stand straight with your back, hips, and head against a wall. Step forward one foot at a time with your back still against the wall. Your feet should be 2 feet (61 cm) from the wall at shoulder width. Keeping your back, hips, and head against the wall, slide down the wall to as close to a sitting position as you can get. Hold for 5-10 seconds, then slowly slide back up. Repeat __________ times. Complete this exercise __________ times a day. Step-ups  Stand in front of a sturdy platform or stool that is about 6 inches (15 cm) high. Slowly step up with your left / right foot, keeping your knee in line with your hip and foot. Do not let your knee bend so far that you cannot see your toes. Hold on to a chair for balance, but do not use it for support. Slowly unlock your knee and lower yourself to the starting position. Repeat __________ times. Complete this exercise __________ times a day. Contact a health care provider if: Your exercises cause pain. Your pain is worse after you exercise. Your pain prevents you from doing your exercises. This information is not intended to replace advice given to you by your health care provider. Make sure you discuss any questions you have with your health care provider. Document Revised: 01/24/2022 Document Reviewed: 01/24/2022 Elsevier Patient Education  2024 [employer]  Hand Exercises Hand exercises can be helpful for almost anyone. They can strengthen your hands and improve flexibility and movement. The exercises can also increase blood flow to the hands. These results can make your work and daily tasks easier for you. Hand exercises can be especially helpful for people who have joint pain from arthritis or nerve damage from using their hands over and over. These exercises can also help  people who injure a hand. Exercises Most of these hand exercises are gentle stretching and motion exercises. It is usually safe to do them often throughout the day. Warming up your hands before exercise may help reduce stiffness. You can do this with gentle massage or by placing your hands in warm water for 10-15 minutes. It is normal to feel some stretching, pulling, tightness, or mild discomfort when you begin new exercises. In time, this will improve. Remember to always be careful and stop right away if you feel sudden, very bad pain or your pain gets worse. You want to get better and be safe. Ask your health care provider which exercises are safe for you. Do exercises exactly as told by your provider and adjust them as told. Do not begin these exercises until told by your provider. Knuckle bend or "claw" fist  Stand or sit with your arm, hand, and all five fingers pointed straight up. Make sure to keep your wrist straight. Gently bend your fingers down toward your palm until the tips of your  fingers are touching your palm. Keep your big knuckle straight and only bend the small knuckles in your fingers. Hold this position for 10 seconds. Straighten your fingers back to your starting position. Repeat this exercise 5-10 times with each hand. Full finger fist  Stand or sit with your arm, hand, and all five fingers pointed straight up. Make sure to keep your wrist straight. Gently bend your fingers into your palm until the tips of your fingers are touching the middle of your palm. Hold this position for 10 seconds. Extend your fingers back to your starting position, stretching every joint fully. Repeat this exercise 5-10 times with each hand. Straight fist  Stand or sit with your arm, hand, and all five fingers pointed straight up. Make sure to keep your wrist straight. Gently bend your fingers at the big knuckle, where your fingers meet your hand, and at the middle knuckle. Keep the knuckle at  the tips of your fingers straight and try to touch the bottom of your palm. Hold this position for 10 seconds. Extend your fingers back to your starting position, stretching every joint fully. Repeat this exercise 5-10 times with each hand. Tabletop  Stand or sit with your arm, hand, and all five fingers pointed straight up. Make sure to keep your wrist straight. Gently bend your fingers at the big knuckle, where your fingers meet your hand, as far down as you can. Keep the small knuckles in your fingers straight. Think of forming a tabletop with your fingers. Hold this position for 10 seconds. Extend your fingers back to your starting position, stretching every joint fully. Repeat this exercise 5-10 times with each hand. Finger spread  Place your hand flat on a table with your palm facing down. Make sure your wrist stays straight. Spread your fingers and thumb apart from each other as far as you can until you feel a gentle stretch. Hold this position for 10 seconds. Bring your fingers and thumb tight together again. Hold this position for 10 seconds. Repeat this exercise 5-10 times with each hand. Making circles  Stand or sit with your arm, hand, and all five fingers pointed straight up. Make sure to keep your wrist straight. Make a circle by touching the tip of your thumb to the tip of your index finger. Hold for 10 seconds. Then open your hand wide. Repeat this motion with your thumb and each of your fingers. Repeat this exercise 5-10 times with each hand. Thumb motion  Sit with your forearm resting on a table and your wrist straight. Your thumb should be facing up toward the ceiling. Keep your fingers relaxed as you move your thumb. Lift your thumb up as high as you can toward the ceiling. Hold for 10 seconds. Bend your thumb across your palm as far as you can, reaching the tip of your thumb for the small finger (pinkie) side of your palm. Hold for 10 seconds. Repeat this exercise  5-10 times with each hand. Grip strengthening  Hold a stress ball or other soft ball in the middle of your hand. Slowly increase the pressure, squeezing the ball as much as you can without causing pain. Think of bringing the tips of your fingers into the middle of your palm. All of your finger joints should bend when doing this exercise. Hold your squeeze for 10 seconds, then relax. Repeat this exercise 5-10 times with each hand. Contact a health care provider if: Your hand pain or discomfort gets much worse when  you do an exercise. Your hand pain or discomfort does not improve within 2 hours after you exercise. If you have either of these problems, stop doing these exercises right away. Do not do them again unless your provider says that you can. Get help right away if: You develop sudden, severe hand pain or swelling. If this happens, stop doing these exercises right away. Do not do them again unless your provider says that you can. This information is not intended to replace advice given to you by your health care provider. Make sure you discuss any questions you have with your health care provider. Document Revised: 01/24/2022 Document Reviewed: 01/24/2022 Elsevier Patient Education  2024 [employer].
